# Patient Record
Sex: Male | Born: 1955 | Race: White | Hispanic: No
Health system: Southern US, Community
[De-identification: ages and names within clinical notes are randomized; demographics above are authoritative.]

---

## 1999-11-04 ENCOUNTER — Emergency Department (HOSPITAL_COMMUNITY): Admission: EM | Admit: 1999-11-04 | Discharge: 1999-11-05 | Payer: Self-pay | Admitting: Internal Medicine

## 1999-11-04 ENCOUNTER — Encounter: Payer: Self-pay | Admitting: Internal Medicine

## 1999-11-05 ENCOUNTER — Encounter: Payer: Self-pay | Admitting: Emergency Medicine

## 2001-06-19 ENCOUNTER — Emergency Department (HOSPITAL_COMMUNITY): Admission: EM | Admit: 2001-06-19 | Discharge: 2001-06-19 | Payer: Self-pay

## 2008-03-15 ENCOUNTER — Emergency Department (HOSPITAL_COMMUNITY): Admission: EM | Admit: 2008-03-15 | Discharge: 2008-03-15 | Payer: Self-pay | Admitting: Emergency Medicine

## 2009-08-06 IMAGING — CT CT HEAD W/O CM
1 series · 16 of 30 positions shown, 20 images · non-contrast
Comparison: None.

CLINICAL DATA: Right hand and arm numbness for days.

CT HEAD WITHOUT CONTRAST
TECHNIQUE: Contiguous axial images were obtained from the base of
the skull through the vertex without contrast.

[Series 2: headseq 4.8 h45s · axial · 0.43mm/px · z∈[-169,-17]mm · 16 of 36 slices shown, 20 images]
[im 2/36  brain]
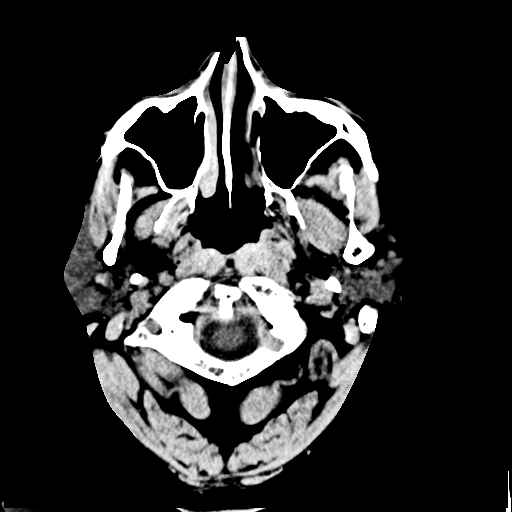
[im 2/36  bone]
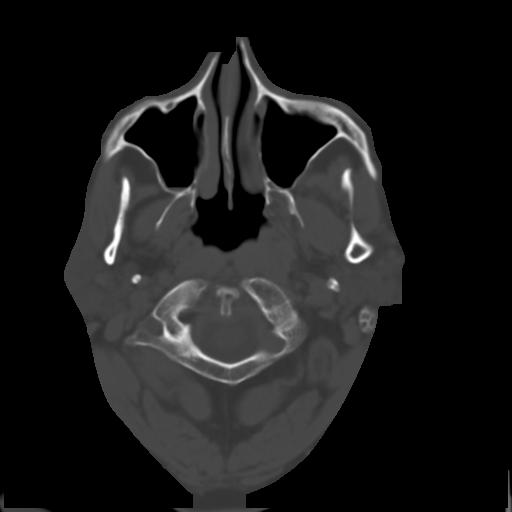
[im 4/36  brain]
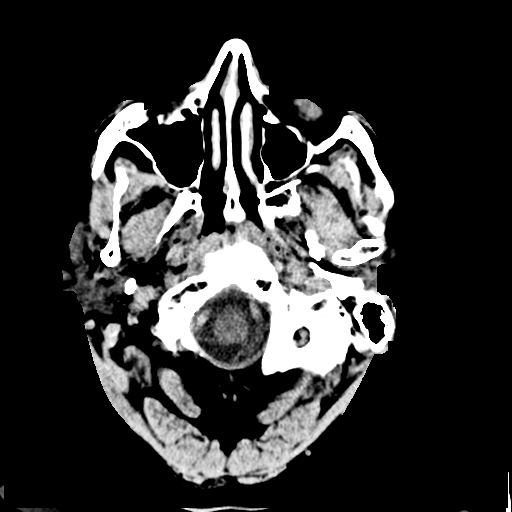
[im 7/36  brain]
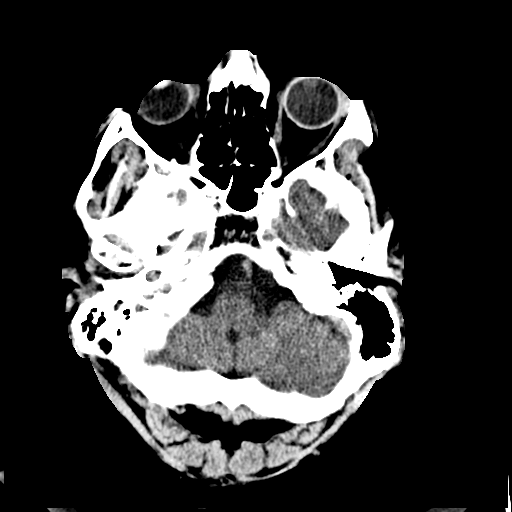
[im 9/36  brain]
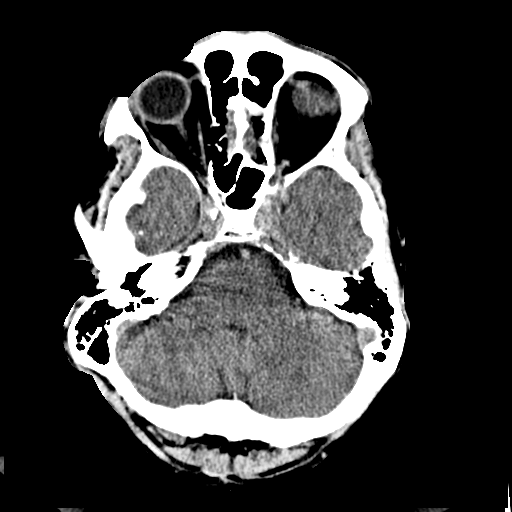
[im 10/36  brain]
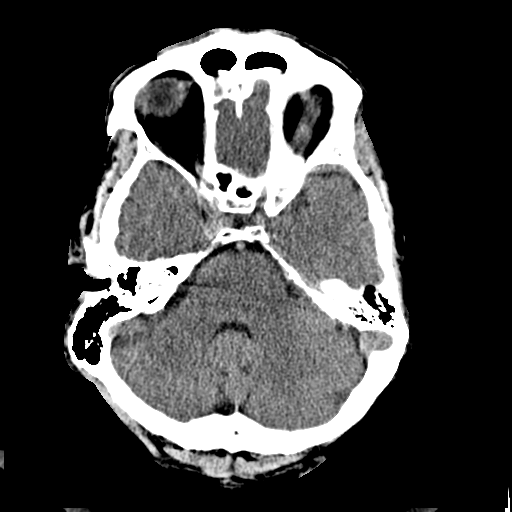
[im 10/36  bone]
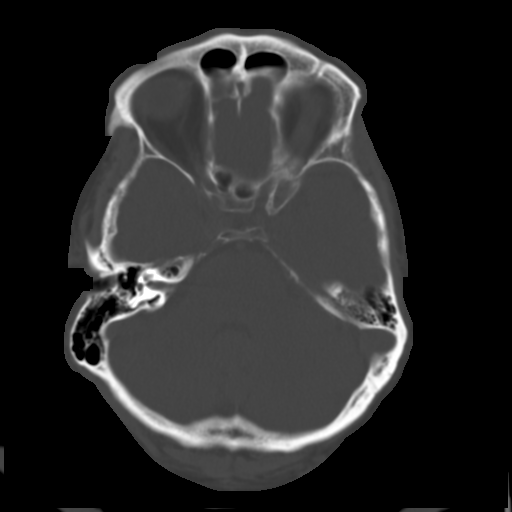
[im 13/36  brain]
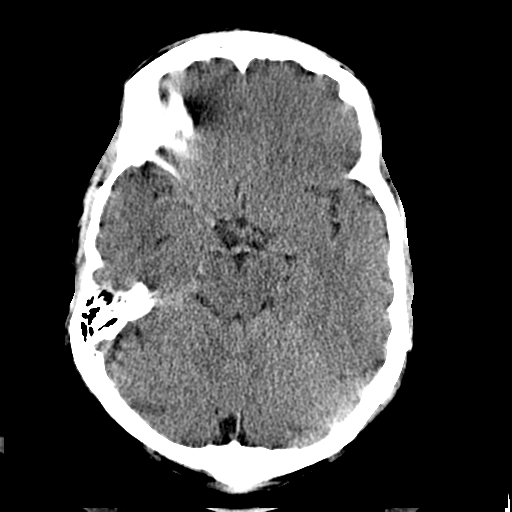
[im 15/36  brain]
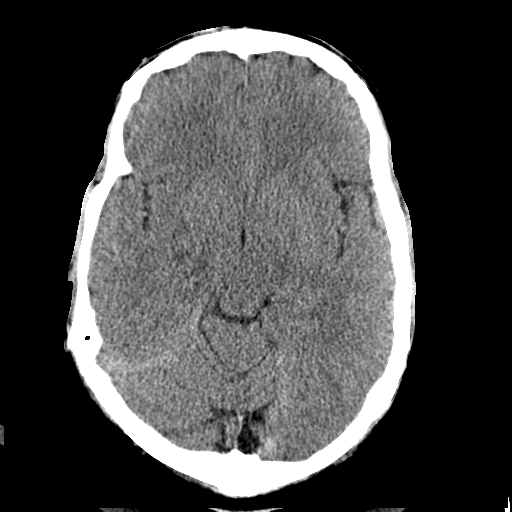
[im 17/36  brain]
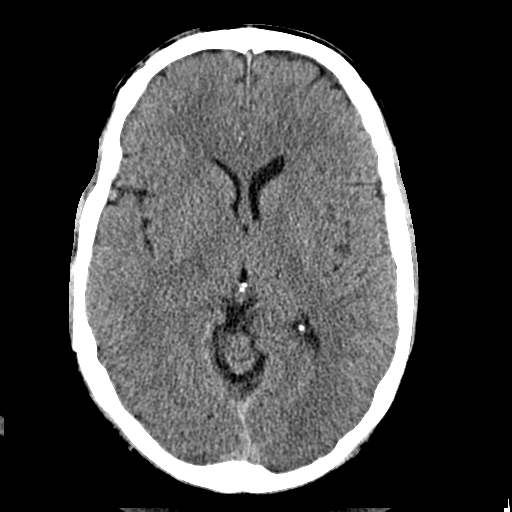
[im 19/36  brain]
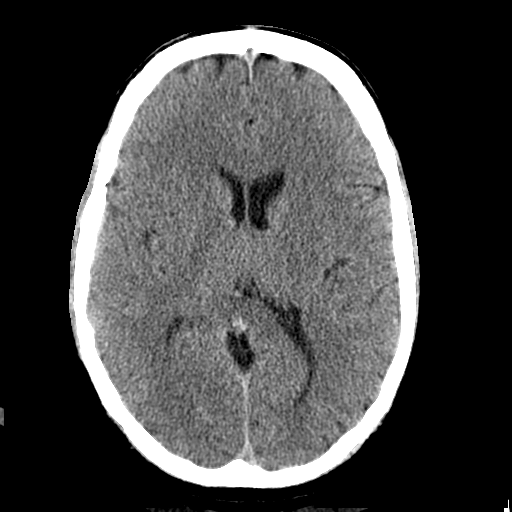
[im 19/36  bone]
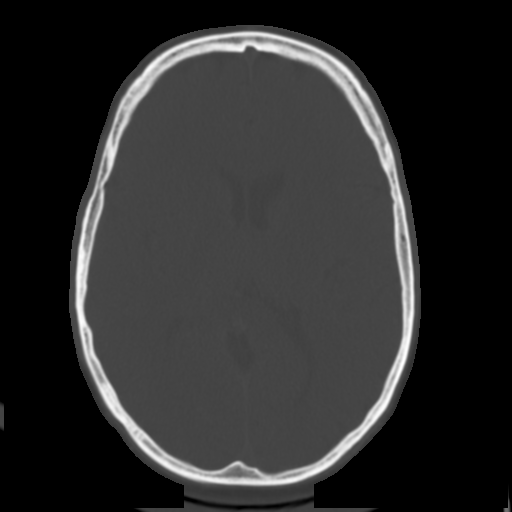
[im 21/36  brain]
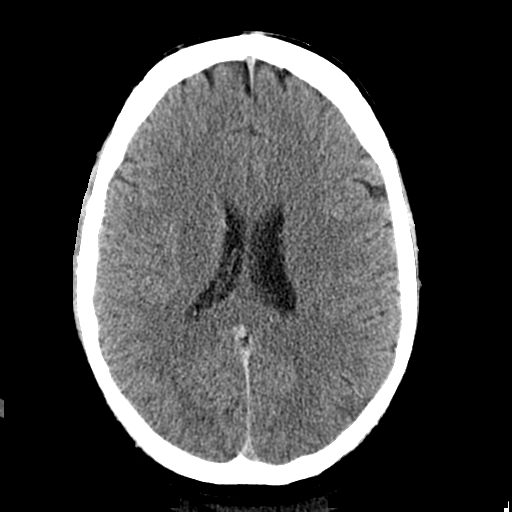
[im 23/36  brain]
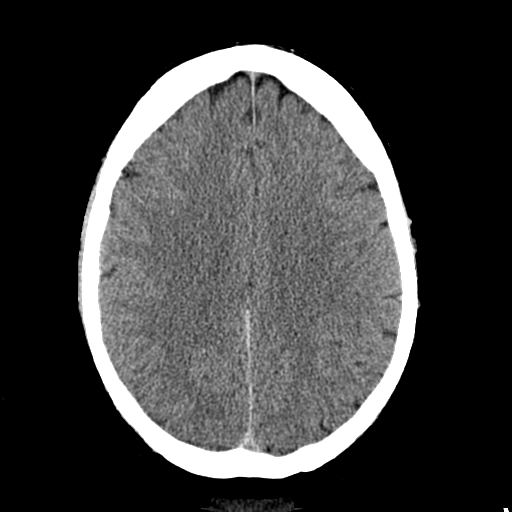
[im 26/36  brain]
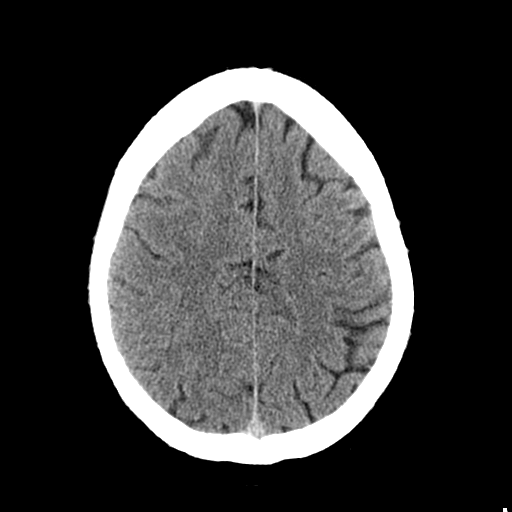
[im 27/36  brain]
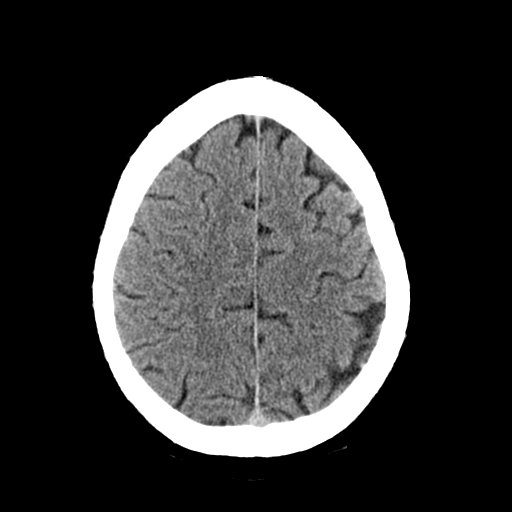
[im 27/36  bone]
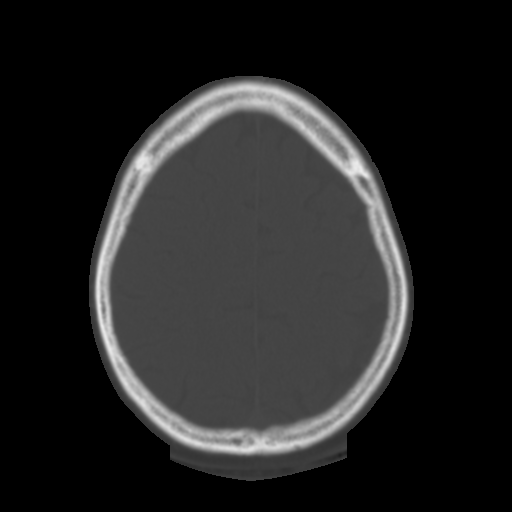
[im 29/36  brain]
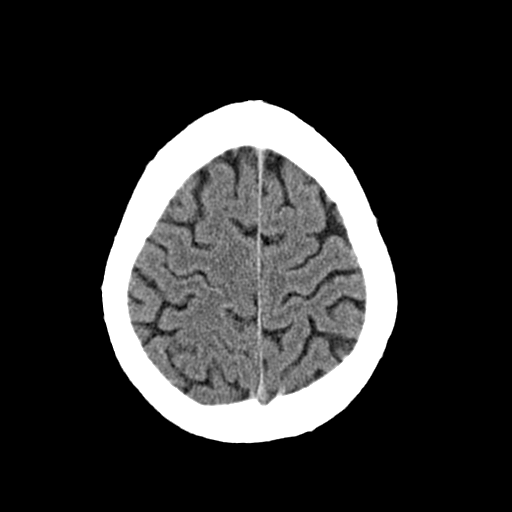
[im 32/36  brain]
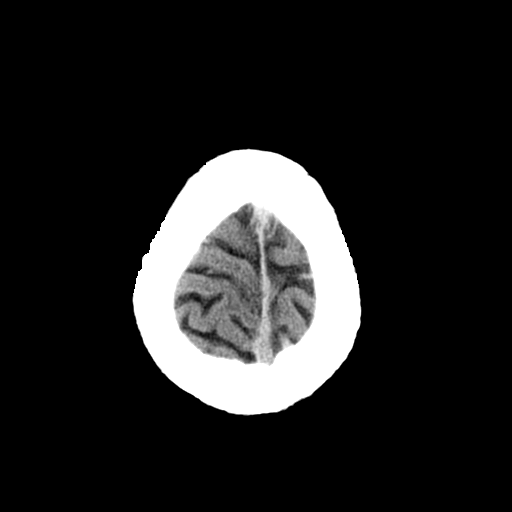
[im 34/36  brain]
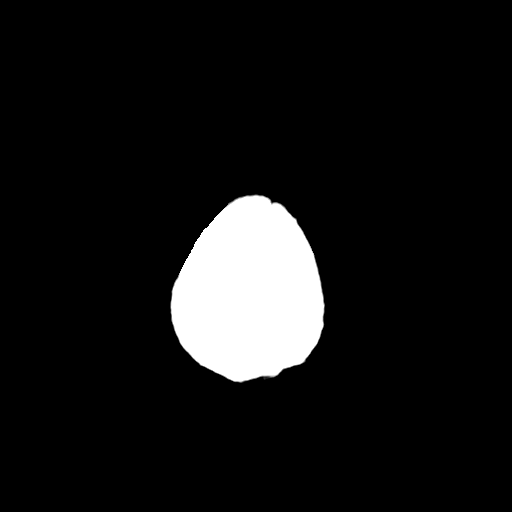

[16 of 30 positions shown; findings below may reference images not displayed]

FINDINGS: No intracranial hemorrhage.  No hydrocephalus.  No
intracranial mass lesion detected on this unenhanced exam.  No CT
evidence of large acute infarct.  Minimal mucosal thickening right
maxillary sinus.  No bony destructive lesion.  If a small acute
infarct is of high clinical concern MR imaging may be considered.
IMPRESSION: No intracranial hemorrhage.

No CT evidence of large acute infarct.  Please see above
discussion.

## 2011-04-14 LAB — COMPREHENSIVE METABOLIC PANEL
Albumin: 3.7
Alkaline Phosphatase: 37 — ABNORMAL LOW
BUN: 4 — ABNORMAL LOW
Calcium: 8.8
Creatinine, Ser: 0.85
Glucose, Bld: 99
Total Protein: 6.3

## 2011-04-14 LAB — CBC
HCT: 48.3
Hemoglobin: 16.5
MCHC: 34.2
MCV: 97.8
Platelets: 194
RBC: 4.93
RDW: 12.5
WBC: 11.4 — ABNORMAL HIGH

## 2011-04-14 LAB — DIFFERENTIAL
Basophils Relative: 0
Lymphocytes Relative: 14
Monocytes Absolute: 0.6
Monocytes Relative: 5
Neutro Abs: 9 — ABNORMAL HIGH
Neutrophils Relative %: 79 — ABNORMAL HIGH

## 2011-04-14 LAB — PROTIME-INR: INR: 0.9

## 2011-04-14 LAB — APTT: aPTT: 20 — ABNORMAL LOW

## 2013-12-21 ENCOUNTER — Encounter (HOSPITAL_COMMUNITY): Payer: Self-pay | Admitting: Emergency Medicine

## 2013-12-21 ENCOUNTER — Emergency Department (HOSPITAL_COMMUNITY): Payer: Medicaid Other

## 2013-12-21 ENCOUNTER — Inpatient Hospital Stay (HOSPITAL_COMMUNITY): Payer: Medicaid Other

## 2013-12-21 ENCOUNTER — Inpatient Hospital Stay (HOSPITAL_COMMUNITY)
Admission: EM | Admit: 2013-12-21 | Discharge: 2014-01-01 | DRG: 248 | Disposition: A | Discharge status: Hospice | Payer: Medicaid Other | Attending: Pulmonary Disease | Admitting: Pulmonary Disease

## 2013-12-21 ENCOUNTER — Encounter (HOSPITAL_COMMUNITY)
Admission: EM | Disposition: A | Discharge status: Hospice | Payer: Medicaid Other | Source: Home / Self Care | Attending: Pulmonary Disease

## 2013-12-21 DIAGNOSIS — G931 Anoxic brain damage, not elsewhere classified: Secondary | ICD-10-CM | POA: Diagnosis present

## 2013-12-21 DIAGNOSIS — G9340 Encephalopathy, unspecified: Secondary | ICD-10-CM | POA: Diagnosis present

## 2013-12-21 DIAGNOSIS — R0682 Tachypnea, not elsewhere classified: Secondary | ICD-10-CM | POA: Diagnosis not present

## 2013-12-21 DIAGNOSIS — E870 Hyperosmolality and hypernatremia: Secondary | ICD-10-CM | POA: Diagnosis not present

## 2013-12-21 DIAGNOSIS — E872 Acidosis, unspecified: Secondary | ICD-10-CM | POA: Diagnosis present

## 2013-12-21 DIAGNOSIS — I4901 Ventricular fibrillation: Secondary | ICD-10-CM | POA: Diagnosis present

## 2013-12-21 DIAGNOSIS — R0681 Apnea, not elsewhere classified: Secondary | ICD-10-CM | POA: Diagnosis not present

## 2013-12-21 DIAGNOSIS — Z6827 Body mass index (BMI) 27.0-27.9, adult: Secondary | ICD-10-CM | POA: Diagnosis not present

## 2013-12-21 DIAGNOSIS — R4020 Unspecified coma: Secondary | ICD-10-CM | POA: Diagnosis present

## 2013-12-21 DIAGNOSIS — Z515 Encounter for palliative care: Secondary | ICD-10-CM | POA: Diagnosis not present

## 2013-12-21 DIAGNOSIS — I469 Cardiac arrest, cause unspecified: Secondary | ICD-10-CM | POA: Diagnosis present

## 2013-12-21 DIAGNOSIS — I498 Other specified cardiac arrhythmias: Secondary | ICD-10-CM | POA: Diagnosis present

## 2013-12-21 DIAGNOSIS — I2119 ST elevation (STEMI) myocardial infarction involving other coronary artery of inferior wall: Secondary | ICD-10-CM | POA: Diagnosis present

## 2013-12-21 DIAGNOSIS — E876 Hypokalemia: Secondary | ICD-10-CM | POA: Diagnosis not present

## 2013-12-21 DIAGNOSIS — I2510 Atherosclerotic heart disease of native coronary artery without angina pectoris: Secondary | ICD-10-CM | POA: Diagnosis present

## 2013-12-21 DIAGNOSIS — I4510 Unspecified right bundle-branch block: Secondary | ICD-10-CM | POA: Diagnosis present

## 2013-12-21 DIAGNOSIS — Z66 Do not resuscitate: Secondary | ICD-10-CM | POA: Diagnosis not present

## 2013-12-21 DIAGNOSIS — I509 Heart failure, unspecified: Secondary | ICD-10-CM | POA: Diagnosis not present

## 2013-12-21 DIAGNOSIS — J690 Pneumonitis due to inhalation of food and vomit: Secondary | ICD-10-CM | POA: Diagnosis not present

## 2013-12-21 DIAGNOSIS — D696 Thrombocytopenia, unspecified: Secondary | ICD-10-CM | POA: Diagnosis not present

## 2013-12-21 DIAGNOSIS — R569 Unspecified convulsions: Secondary | ICD-10-CM | POA: Diagnosis not present

## 2013-12-21 DIAGNOSIS — I4891 Unspecified atrial fibrillation: Secondary | ICD-10-CM | POA: Diagnosis present

## 2013-12-21 DIAGNOSIS — J9601 Acute respiratory failure with hypoxia: Secondary | ICD-10-CM | POA: Diagnosis present

## 2013-12-21 DIAGNOSIS — J9600 Acute respiratory failure, unspecified whether with hypoxia or hypercapnia: Secondary | ICD-10-CM | POA: Diagnosis present

## 2013-12-21 DIAGNOSIS — D649 Anemia, unspecified: Secondary | ICD-10-CM | POA: Diagnosis present

## 2013-12-21 DIAGNOSIS — F05 Delirium due to known physiological condition: Secondary | ICD-10-CM | POA: Diagnosis not present

## 2013-12-21 DIAGNOSIS — I5021 Acute systolic (congestive) heart failure: Secondary | ICD-10-CM | POA: Diagnosis not present

## 2013-12-21 DIAGNOSIS — I369 Nonrheumatic tricuspid valve disorder, unspecified: Secondary | ICD-10-CM

## 2013-12-21 DIAGNOSIS — R7309 Other abnormal glucose: Secondary | ICD-10-CM | POA: Diagnosis present

## 2013-12-21 HISTORY — PX: LEFT HEART CATHETERIZATION WITH CORONARY ANGIOGRAM: SHX5451

## 2013-12-21 HISTORY — PX: PERCUTANEOUS CORONARY STENT INTERVENTION (PCI-S): SHX5485

## 2013-12-21 LAB — BASIC METABOLIC PANEL
BUN: 10 mg/dL (ref 6–23)
BUN: 12 mg/dL (ref 6–23)
BUN: 14 mg/dL (ref 6–23)
BUN: 14 mg/dL (ref 6–23)
BUN: 17 mg/dL (ref 6–23)
BUN: 18 mg/dL (ref 6–23)
BUN: 18 mg/dL (ref 6–23)
BUN: 17 mg/dL (ref 6–23)
CALCIUM: 7.8 mg/dL — AB (ref 8.4–10.5)
CHLORIDE: 107 meq/L (ref 96–112)
CHLORIDE: 114 meq/L — AB (ref 96–112)
CHLORIDE: 112 meq/L (ref 96–112)
CO2: 15 meq/L — AB (ref 19–32)
CO2: 21 meq/L (ref 19–32)
CO2: 22 meq/L (ref 19–32)
CO2: 22 mEq/L (ref 19–32)
CO2: 20 mEq/L (ref 19–32)
CO2: 18 meq/L — AB (ref 19–32)
CO2: 19 mEq/L (ref 19–32)
CO2: 19 mEq/L (ref 19–32)
CREATININE: 1.02 mg/dL (ref 0.50–1.35)
CREATININE: 0.63 mg/dL (ref 0.50–1.35)
CREATININE: 0.83 mg/dL (ref 0.50–1.35)
CREATININE: 0.74 mg/dL (ref 0.50–1.35)
Calcium: 8 mg/dL — ABNORMAL LOW (ref 8.4–10.5)
Calcium: 7 mg/dL — ABNORMAL LOW (ref 8.4–10.5)
Calcium: 7.5 mg/dL — ABNORMAL LOW (ref 8.4–10.5)
Calcium: 7.5 mg/dL — ABNORMAL LOW (ref 8.4–10.5)
Calcium: 7.2 mg/dL — ABNORMAL LOW (ref 8.4–10.5)
Calcium: 7 mg/dL — ABNORMAL LOW (ref 8.4–10.5)
Calcium: 7.1 mg/dL — ABNORMAL LOW (ref 8.4–10.5)
Chloride: 104 mEq/L (ref 96–112)
Chloride: 112 mEq/L (ref 96–112)
Chloride: 115 mEq/L — ABNORMAL HIGH (ref 96–112)
Chloride: 113 mEq/L — ABNORMAL HIGH (ref 96–112)
Chloride: 112 mEq/L (ref 96–112)
Creatinine, Ser: 0.7 mg/dL (ref 0.50–1.35)
Creatinine, Ser: 0.68 mg/dL (ref 0.50–1.35)
Creatinine, Ser: 0.65 mg/dL (ref 0.50–1.35)
Creatinine, Ser: 0.74 mg/dL (ref 0.50–1.35)
GFR calc Af Amer: >90 mL/min (ref >90)
GFR calc Af Amer: >90 mL/min (ref >90)
GFR calc Af Amer: >90 mL/min (ref >90)
GFR calc Af Amer: >90 mL/min (ref >90)
GFR calc Af Amer: >90 mL/min (ref >90)
GFR calc non Af Amer: 80 mL/min — ABNORMAL LOW (ref >90)
GFR calc non Af Amer: >90 mL/min (ref >90)
GFR calc non Af Amer: >90 mL/min (ref >90)
GFR calc non Af Amer: >90 mL/min (ref >90)
GFR calc non Af Amer: >90 mL/min (ref >90)
GFR calc non Af Amer: >90 mL/min (ref >90)
GFR calc non Af Amer: >90 mL/min (ref >90)
GLUCOSE: 162 mg/dL — AB (ref 70–99)
Glucose, Bld: 392 mg/dL — ABNORMAL HIGH (ref 70–99)
Glucose, Bld: 114 mg/dL — ABNORMAL HIGH (ref 70–99)
Glucose, Bld: 241 mg/dL — ABNORMAL HIGH (ref 70–99)
Glucose, Bld: 116 mg/dL — ABNORMAL HIGH (ref 70–99)
Glucose, Bld: 353 mg/dL — ABNORMAL HIGH (ref 70–99)
Glucose, Bld: 192 mg/dL — ABNORMAL HIGH (ref 70–99)
Glucose, Bld: 141 mg/dL — ABNORMAL HIGH (ref 70–99)
POTASSIUM: 4.2 meq/L (ref 3.7–5.3)
POTASSIUM: 4.1 meq/L (ref 3.7–5.3)
POTASSIUM: 4.3 meq/L (ref 3.7–5.3)
Potassium: 3.1 mEq/L — ABNORMAL LOW (ref 3.7–5.3)
Potassium: 4 mEq/L (ref 3.7–5.3)
Potassium: 3.2 mEq/L — ABNORMAL LOW (ref 3.7–5.3)
Potassium: 4.1 mEq/L (ref 3.7–5.3)
Potassium: 4.2 mEq/L (ref 3.7–5.3)
SODIUM: 149 meq/L — AB (ref 137–147)
SODIUM: 145 meq/L (ref 137–147)
Sodium: 145 mEq/L (ref 137–147)
Sodium: 146 mEq/L (ref 137–147)
Sodium: 148 mEq/L — ABNORMAL HIGH (ref 137–147)
Sodium: 149 mEq/L — ABNORMAL HIGH (ref 137–147)
Sodium: 145 mEq/L (ref 137–147)
Sodium: 147 mEq/L (ref 137–147)

## 2013-12-21 LAB — GLUCOSE, CAPILLARY
GLUCOSE-CAPILLARY: 297 mg/dL — AB (ref 70–99)
GLUCOSE-CAPILLARY: 220 mg/dL — AB (ref 70–99)
GLUCOSE-CAPILLARY: 213 mg/dL — AB (ref 70–99)
GLUCOSE-CAPILLARY: 148 mg/dL — AB (ref 70–99)
GLUCOSE-CAPILLARY: 137 mg/dL — AB (ref 70–99)
GLUCOSE-CAPILLARY: 103 mg/dL — AB (ref 70–99)
GLUCOSE-CAPILLARY: 104 mg/dL — AB (ref 70–99)
GLUCOSE-CAPILLARY: 155 mg/dL — AB (ref 70–99)
GLUCOSE-CAPILLARY: 134 mg/dL — AB (ref 70–99)
GLUCOSE-CAPILLARY: 129 mg/dL — AB (ref 70–99)
GLUCOSE-CAPILLARY: 126 mg/dL — AB (ref 70–99)
Glucose-Capillary: 145 mg/dL — ABNORMAL HIGH (ref 70–99)
Glucose-Capillary: 67 mg/dL — ABNORMAL LOW (ref 70–99)
Glucose-Capillary: 141 mg/dL — ABNORMAL HIGH (ref 70–99)

## 2013-12-21 LAB — APTT
APTT: 38 s — AB (ref 24–37)
APTT: 31 s (ref 24–37)
aPTT: 32 seconds (ref 24–37)

## 2013-12-21 LAB — BLOOD GAS, ARTERIAL
Acid-base deficit: 1.5 mmol/L (ref 0.0–2.0)
Bicarbonate: 23.5 mEq/L (ref 20.0–24.0)
DRAWN BY: 10006
FIO2: 1 %
LHR: 20 {breaths}/min
O2 SAT: 96.4 %
PCO2 ART: 41.2 mmHg (ref 35.0–45.0)
PEEP: 5 cmH2O
PO2 ART: 84 mmHg (ref 80.0–100.0)
Patient temperature: 95.4
TCO2: 24.9 mmol/L (ref 0–100)
VT: 500 mL
pH, Arterial: 7.364 (ref 7.350–7.450)

## 2013-12-21 LAB — CBC
HCT: 43.2 % (ref 39.0–52.0)
HCT: 43.9 % (ref 39.0–52.0)
HEMATOCRIT: 43.6 % (ref 39.0–52.0)
HEMOGLOBIN: 14.9 g/dL (ref 13.0–17.0)
Hemoglobin: 14.6 g/dL (ref 13.0–17.0)
Hemoglobin: 14.6 g/dL (ref 13.0–17.0)
MCH: 34.2 pg — ABNORMAL HIGH (ref 26.0–34.0)
MCH: 33.7 pg (ref 26.0–34.0)
MCH: 33.5 pg (ref 26.0–34.0)
MCHC: 33.8 g/dL (ref 30.0–36.0)
MCHC: 33.9 g/dL (ref 30.0–36.0)
MCHC: 33.5 g/dL (ref 30.0–36.0)
MCV: 101.2 fL — ABNORMAL HIGH (ref 78.0–100.0)
MCV: 99.3 fL (ref 78.0–100.0)
MCV: 100 fL (ref 78.0–100.0)
Platelets: 149 10*3/uL — ABNORMAL LOW (ref 150–400)
Platelets: DECREASED 10*3/uL (ref 150–400)
Platelets: 109 10*3/uL — ABNORMAL LOW (ref 150–400)
RBC: 4.27 MIL/uL (ref 4.22–5.81)
RBC: 4.42 MIL/uL (ref 4.22–5.81)
RBC: 4.36 MIL/uL (ref 4.22–5.81)
RDW: 12.4 % (ref 11.5–15.5)
RDW: 12.3 % (ref 11.5–15.5)
RDW: 12.6 % (ref 11.5–15.5)
WBC: 9 10*3/uL (ref 4.0–10.5)
WBC: 10.4 10*3/uL (ref 4.0–10.5)
WBC: 13 10*3/uL — ABNORMAL HIGH (ref 4.0–10.5)

## 2013-12-21 LAB — CBC WITH DIFFERENTIAL/PLATELET
Basophils Absolute: 0 10*3/uL (ref 0.0–0.1)
Basophils Relative: 1 % (ref 0–1)
Eosinophils Absolute: 0.2 10*3/uL (ref 0.0–0.7)
Eosinophils Relative: 2 % (ref 0–5)
HEMATOCRIT: 41 % (ref 39.0–52.0)
HEMOGLOBIN: 12.9 g/dL — AB (ref 13.0–17.0)
LYMPHS ABS: 4.7 10*3/uL — AB (ref 0.7–4.0)
LYMPHS PCT: 57 % — AB (ref 12–46)
MCH: 32.8 pg (ref 26.0–34.0)
MCHC: 31.5 g/dL (ref 30.0–36.0)
MCV: 104.3 fL — ABNORMAL HIGH (ref 78.0–100.0)
MONO ABS: 0.3 10*3/uL (ref 0.1–1.0)
Monocytes Relative: 4 % (ref 3–12)
NEUTROS PCT: 36 % — AB (ref 43–77)
Neutro Abs: 3 10*3/uL (ref 1.7–7.7)
Platelets: 92 10*3/uL — ABNORMAL LOW (ref 150–400)
RBC: 3.93 MIL/uL — AB (ref 4.22–5.81)
RDW: 12.4 % (ref 11.5–15.5)
WBC: 8.2 10*3/uL (ref 4.0–10.5)

## 2013-12-21 LAB — POCT I-STAT 3, ART BLOOD GAS (G3+)
Acid-base deficit: 10 mmol/L — ABNORMAL HIGH (ref 0.0–2.0)
Bicarbonate: 17.5 mEq/L — ABNORMAL LOW (ref 20.0–24.0)
O2 Saturation: 100 %
PCO2 ART: 43.8 mmHg (ref 35.0–45.0)
PH ART: 7.21 — AB (ref 7.350–7.450)
TCO2: 19 mmol/L (ref 0–100)
pO2, Arterial: 384 mmHg — ABNORMAL HIGH (ref 80.0–100.0)

## 2013-12-21 LAB — MRSA PCR SCREENING: MRSA by PCR: NEGATIVE

## 2013-12-21 LAB — PHOSPHORUS: Phosphorus: 1.7 mg/dL — ABNORMAL LOW (ref 2.3–4.6)

## 2013-12-21 LAB — TROPONIN I
TROPONIN I: 16.46 ng/mL — AB (ref <0.30)
TROPONIN I: 17.11 ng/mL — AB (ref <0.30)
Troponin I: 8.87 ng/mL (ref <0.30)

## 2013-12-21 LAB — I-STAT ARTERIAL BLOOD GAS, ED
ACID-BASE DEFICIT: 16 mmol/L — AB (ref 0.0–2.0)
BICARBONATE: 16 meq/L — AB (ref 20.0–24.0)
O2 SAT: 100 %
Patient temperature: 35.6
TCO2: 18 mmol/L (ref 0–100)
pCO2 arterial: 61.5 mmHg (ref 35.0–45.0)
pH, Arterial: 7.013 — CL (ref 7.350–7.450)
pO2, Arterial: 316 mmHg — ABNORMAL HIGH (ref 80.0–100.0)

## 2013-12-21 LAB — URINALYSIS, ROUTINE W REFLEX MICROSCOPIC
Glucose, UA: NEGATIVE mg/dL
Ketones, ur: NEGATIVE mg/dL
LEUKOCYTES UA: NEGATIVE
Nitrite: NEGATIVE
PROTEIN: 100 mg/dL — AB
Specific Gravity, Urine: 1.022 (ref 1.005–1.030)
UROBILINOGEN UA: 1 mg/dL (ref 0.0–1.0)
pH: 6 (ref 5.0–8.0)

## 2013-12-21 LAB — I-STAT TROPONIN, ED: TROPONIN I, POC: 0.19 ng/mL — AB (ref 0.00–0.08)

## 2013-12-21 LAB — LACTIC ACID, PLASMA
LACTIC ACID, VENOUS: 1.8 mmol/L (ref 0.5–2.2)
LACTIC ACID, VENOUS: 2.1 mmol/L (ref 0.5–2.2)
Lactic Acid, Venous: 2.2 mmol/L (ref 0.5–2.2)

## 2013-12-21 LAB — PROTIME-INR
INR: 1.37 (ref 0.00–1.49)
INR: 1.21 (ref 0.00–1.49)
INR: 1.11 (ref 0.00–1.49)
Prothrombin Time: 16.5 seconds — ABNORMAL HIGH (ref 11.6–15.2)
Prothrombin Time: 15 seconds (ref 11.6–15.2)
Prothrombin Time: 14.1 seconds (ref 11.6–15.2)

## 2013-12-21 LAB — MAGNESIUM: Magnesium: 1.6 mg/dL (ref 1.5–2.5)

## 2013-12-21 LAB — POCT ACTIVATED CLOTTING TIME: ACTIVATED CLOTTING TIME: 420 s

## 2013-12-21 LAB — URINE MICROSCOPIC-ADD ON

## 2013-12-21 LAB — PLATELET COUNT: PLATELETS: 130 10*3/uL — AB (ref 150–400)

## 2013-12-21 LAB — CORTISOL: Cortisol, Plasma: 28.1 ug/dL

## 2013-12-21 LAB — I-STAT CG4 LACTIC ACID, ED: Lactic Acid, Venous: 8.94 mmol/L — ABNORMAL HIGH (ref 0.5–2.2)

## 2013-12-21 SURGERY — LEFT HEART CATHETERIZATION WITH CORONARY ANGIOGRAM
Anesthesia: LOCAL

## 2013-12-21 MED ORDER — FENTANYL CITRATE 0.05 MG/ML IJ SOLN
25.0000 ug/h | INTRAMUSCULAR | Status: DC
  Administered 2013-12-21: 50 ug/h via INTRAVENOUS
  Filled 2013-12-21: qty 50

## 2013-12-21 MED ORDER — ATROPINE SULFATE 1 MG/ML IJ SOLN
INTRAMUSCULAR | Status: DC | PRN
  Administered 2013-12-21: 1 mg via INTRAVENOUS

## 2013-12-21 MED ORDER — LIDOCAINE HCL (PF) 1 % IJ SOLN
INTRAMUSCULAR | Status: AC
  Filled 2013-12-21: qty 30

## 2013-12-21 MED ORDER — CHLORHEXIDINE GLUCONATE 0.12 % MT SOLN
15.0000 mL | Freq: Two times a day (BID) | OROMUCOSAL | Status: DC
  Administered 2013-12-21 – 2013-12-27 (×13): 15 mL via OROMUCOSAL
  Filled 2013-12-21 (×13): qty 15

## 2013-12-21 MED ORDER — CISATRACURIUM BOLUS VIA INFUSION
0.0500 mg/kg | INTRAVENOUS | Status: DC | PRN
  Filled 2013-12-21: qty 4

## 2013-12-21 MED ORDER — BIOTENE DRY MOUTH MT LIQD
15.0000 mL | Freq: Four times a day (QID) | OROMUCOSAL | Status: DC
Start: 2013-12-21 — End: 2013-12-27
  Administered 2013-12-21 – 2013-12-27 (×24): 15 mL via OROMUCOSAL

## 2013-12-21 MED ORDER — SODIUM CHLORIDE 0.9 % IV SOLN
250.0000 mL | INTRAVENOUS | Status: DC | PRN
  Administered 2013-12-21: 250 mL via INTRAVENOUS

## 2013-12-21 MED ORDER — TIROFIBAN HCL IV 5 MG/100ML
0.1500 ug/kg/min | INTRAVENOUS | Status: DC
  Administered 2013-12-21 – 2013-12-22 (×4): 0.15 ug/kg/min via INTRAVENOUS
  Filled 2013-12-21 (×8): qty 100

## 2013-12-21 MED ORDER — HEPARIN SODIUM (PORCINE) 5000 UNIT/ML IJ SOLN: 5000.0000 [IU] | Freq: Three times a day (TID) | INTRAMUSCULAR | Status: DC

## 2013-12-21 MED ORDER — MIDAZOLAM HCL 2 MG/2ML IJ SOLN: 2.0000 mg | Freq: Once | INTRAMUSCULAR | Status: AC | PRN

## 2013-12-21 MED ORDER — ASPIRIN 81 MG PO CHEW: 324.0000 mg | CHEWABLE_TABLET | ORAL | Status: AC

## 2013-12-21 MED ORDER — FENTANYL CITRATE 0.05 MG/ML IJ SOLN: 100.0000 ug | Freq: Once | INTRAMUSCULAR | Status: AC | PRN

## 2013-12-21 MED ORDER — SODIUM CHLORIDE 0.9 % IV SOLN: 2000.0000 mL | Freq: Once | INTRAVENOUS | Status: AC

## 2013-12-21 MED ORDER — ASPIRIN 300 MG RE SUPP
300.0000 mg | RECTAL | Status: DC
  Administered 2013-12-21: 300 mg via RECTAL

## 2013-12-21 MED ORDER — NOREPINEPHRINE BITARTRATE 1 MG/ML IV SOLN
2.0000 ug/min | Freq: Once | INTRAVENOUS | Status: AC
  Administered 2013-12-21: 5 ug/min via INTRAVENOUS

## 2013-12-21 MED ORDER — ASPIRIN 300 MG RE SUPP: 300.0000 mg | RECTAL | Status: AC

## 2013-12-21 MED ORDER — CISATRACURIUM BOLUS VIA INFUSION
0.1000 mg/kg | Freq: Once | INTRAVENOUS | Status: AC
  Administered 2013-12-21: 7.7 mg via INTRAVENOUS
  Filled 2013-12-21: qty 8

## 2013-12-21 MED ORDER — SODIUM CHLORIDE 0.9 % IV SOLN
INTRAVENOUS | Status: DC
  Administered 2013-12-22: 02:00:00 via INTRAVENOUS
  Administered 2013-12-27: 10 mL/h via INTRAVENOUS

## 2013-12-21 MED ORDER — CISATRACURIUM BOLUS VIA INFUSION
0.1000 mg/kg | Freq: Once | INTRAVENOUS | Status: DC
  Filled 2013-12-21: qty 8

## 2013-12-21 MED ORDER — BIVALIRUDIN 250 MG IV SOLR
INTRAVENOUS | Status: AC
  Filled 2013-12-21: qty 250

## 2013-12-21 MED ORDER — TICAGRELOR 90 MG PO TABS
180.0000 mg | ORAL_TABLET | Freq: Once | ORAL | Status: AC
  Administered 2013-12-21: 180 mg via ORAL
  Filled 2013-12-21: qty 2

## 2013-12-21 MED ORDER — SODIUM CHLORIDE 0.9 % IV SOLN: INTRAVENOUS | Status: DC

## 2013-12-21 MED ORDER — FENTANYL BOLUS VIA INFUSION
50.0000 ug | INTRAVENOUS | Status: DC | PRN
  Administered 2013-12-21 – 2013-12-22 (×3): 50 ug via INTRAVENOUS
  Filled 2013-12-21: qty 50

## 2013-12-21 MED ORDER — SODIUM CHLORIDE 0.9 % IV SOLN
1.0000 mg/h | INTRAVENOUS | Status: DC
  Administered 2013-12-21: 2 mg/h via INTRAVENOUS
  Administered 2013-12-21: 4 mg/h via INTRAVENOUS
  Administered 2013-12-21: 2 mg/h via INTRAVENOUS
  Administered 2013-12-22 (×2): 5 mg/h via INTRAVENOUS
  Filled 2013-12-21 (×4): qty 10

## 2013-12-21 MED ORDER — DEXTROSE 50 % IV SOLN
INTRAVENOUS | Status: AC
Start: 2013-12-21 — End: 2013-12-22
  Filled 2013-12-21: qty 50

## 2013-12-21 MED ORDER — HEPARIN (PORCINE) IN NACL 2-0.9 UNIT/ML-% IJ SOLN
INTRAMUSCULAR | Status: AC
  Filled 2013-12-21: qty 1000

## 2013-12-21 MED ORDER — POTASSIUM CHLORIDE 10 MEQ/50ML IV SOLN
10.0000 meq | INTRAVENOUS | Status: AC
  Administered 2013-12-21 (×3): 10 meq via INTRAVENOUS
  Filled 2013-12-21 (×3): qty 50

## 2013-12-21 MED ORDER — MIDAZOLAM HCL 2 MG/2ML IJ SOLN: 2.0000 mg | Freq: Once | INTRAMUSCULAR | Status: AC

## 2013-12-21 MED ORDER — NOREPINEPHRINE BITARTRATE 1 MG/ML IV SOLN
0.5000 ug/min | INTRAVENOUS | Status: DC
  Administered 2013-12-22: 3 ug/min via INTRAVENOUS
  Filled 2013-12-21 (×3): qty 4

## 2013-12-21 MED ORDER — TICAGRELOR 90 MG PO TABS
90.0000 mg | ORAL_TABLET | Freq: Two times a day (BID) | ORAL | Status: DC
  Administered 2013-12-22 – 2013-12-27 (×11): 90 mg via NASOGASTRIC
  Filled 2013-12-21 (×12): qty 1

## 2013-12-21 MED ORDER — SODIUM CHLORIDE 0.9 % IV SOLN
1.0000 ug/kg/min | INTRAVENOUS | Status: DC
  Administered 2013-12-21: 1 ug/kg/min via INTRAVENOUS

## 2013-12-21 MED ORDER — FENTANYL CITRATE 0.05 MG/ML IJ SOLN
100.0000 ug | Freq: Once | INTRAMUSCULAR | Status: AC
  Administered 2013-12-21: 100 ug via INTRAVENOUS

## 2013-12-21 MED ORDER — ARTIFICIAL TEARS OP OINT
1.0000 "application " | TOPICAL_OINTMENT | Freq: Three times a day (TID) | OPHTHALMIC | Status: DC
  Administered 2013-12-21 – 2013-12-22 (×6): 1 via OPHTHALMIC
  Filled 2013-12-21 (×2): qty 3.5

## 2013-12-21 MED ORDER — ASPIRIN 81 MG PO CHEW
81.0000 mg | CHEWABLE_TABLET | Freq: Every day | ORAL | Status: DC
  Administered 2013-12-22 – 2013-12-27 (×6): 81 mg via NASOGASTRIC
  Filled 2013-12-21 (×6): qty 1

## 2013-12-21 MED ORDER — SODIUM CHLORIDE 0.9 % IV SOLN
250.0000 mL | INTRAVENOUS | Status: DC
  Administered 2013-12-23 – 2013-12-24 (×2): 250 mL via INTRAVENOUS

## 2013-12-21 MED ORDER — INSULIN ASPART 100 UNIT/ML ~~LOC~~ SOLN
2.0000 [IU] | SUBCUTANEOUS | Status: DC
  Administered 2013-12-21: 6 [IU] via SUBCUTANEOUS

## 2013-12-21 MED ORDER — ASPIRIN 300 MG RE SUPP: 300.0000 mg | RECTAL | Status: DC

## 2013-12-21 MED ORDER — PANTOPRAZOLE SODIUM 40 MG IV SOLR
40.0000 mg | Freq: Every day | INTRAVENOUS | Status: DC
  Administered 2013-12-21 – 2013-12-23 (×3): 40 mg via INTRAVENOUS
  Filled 2013-12-21 (×4): qty 40

## 2013-12-21 MED ORDER — FENTANYL CITRATE 0.05 MG/ML IJ SOLN
25.0000 ug/h | INTRAMUSCULAR | Status: DC
  Administered 2013-12-21: 150 ug/h via INTRAVENOUS
  Administered 2013-12-21: 100 ug/h via INTRAVENOUS
  Filled 2013-12-21 (×2): qty 50

## 2013-12-21 MED ORDER — SODIUM CHLORIDE 0.9 % IV SOLN
INTRAVENOUS | Status: DC
  Administered 2013-12-21: 21:00:00 via INTRAVENOUS

## 2013-12-21 MED ORDER — SODIUM CHLORIDE 0.9 % IV SOLN
INTRAVENOUS | Status: DC
  Filled 2013-12-21: qty 1

## 2013-12-21 MED ORDER — DEXTROSE 5 % IV SOLN
0.5000 ug/min | INTRAVENOUS | Status: DC
  Filled 2013-12-21: qty 4

## 2013-12-21 MED ORDER — SODIUM CHLORIDE 0.9 % IV SOLN
INTRAVENOUS | Status: DC
  Administered 2013-12-21: 1000 mL via INTRAVENOUS
  Administered 2013-12-22: 01:00:00 via INTRAVENOUS

## 2013-12-21 MED ORDER — TIROFIBAN HCL IV 5 MG/100ML
INTRAVENOUS | Status: AC
Start: 2013-12-21 — End: 2013-12-21
  Filled 2013-12-21: qty 100

## 2013-12-21 MED ORDER — SODIUM CHLORIDE 0.9 % IV SOLN
1.0000 ug/kg/min | INTRAVENOUS | Status: DC
  Administered 2013-12-21: 1 ug/kg/min via INTRAVENOUS
  Filled 2013-12-21: qty 20

## 2013-12-21 MED ORDER — SODIUM CHLORIDE 0.9 % IV SOLN
2000.0000 mL | Freq: Once | INTRAVENOUS | Status: AC
  Administered 2013-12-21: 2000 mL via INTRAVENOUS

## 2013-12-21 MED ORDER — DEXTROSE 50 % IV SOLN
25.0000 mL | Freq: Once | INTRAVENOUS | Status: AC | PRN
  Administered 2013-12-21: 25 mL via INTRAVENOUS

## 2013-12-21 MED ORDER — INSULIN ASPART 100 UNIT/ML ~~LOC~~ SOLN
2.0000 [IU] | SUBCUTANEOUS | Status: DC
  Administered 2013-12-21 (×2): 2 [IU] via SUBCUTANEOUS
  Administered 2013-12-21: 4 [IU] via SUBCUTANEOUS
  Administered 2013-12-21 – 2013-12-24 (×5): 2 [IU] via SUBCUTANEOUS

## 2013-12-21 MED ORDER — NITROGLYCERIN 0.2 MG/ML ON CALL CATH LAB
INTRAVENOUS | Status: AC
  Filled 2013-12-21: qty 1

## 2013-12-21 MED ORDER — HEPARIN SODIUM (PORCINE) 5000 UNIT/ML IJ SOLN
5000.0000 [IU] | Freq: Three times a day (TID) | INTRAMUSCULAR | Status: DC
  Administered 2013-12-21 – 2013-12-27 (×18): 5000 [IU] via SUBCUTANEOUS
  Filled 2013-12-21 (×22): qty 1

## 2013-12-21 MED ORDER — MIDAZOLAM BOLUS VIA INFUSION
2.0000 mg | INTRAVENOUS | Status: DC | PRN
  Administered 2013-12-21 – 2013-12-22 (×3): 2 mg via INTRAVENOUS
  Filled 2013-12-21: qty 2

## 2013-12-21 MED FILL — Medication: Qty: 1 | Status: AC

## 2013-12-21 NOTE — Progress Notes (Signed)
Dr. Zachery ConchFriedman with cardiology paged, made aware of platelet count of 130, also aware patient on Aggrastat infusion until rewarmed on hypothermia protocol per order. Instructed to continue Aggrastat infusion at current rate, obtain platelet count with ordered CBC at 0500 12/22/13, and page MD if platelet count <100. Will continue to assess and monitor patient closely.

## 2013-12-21 NOTE — Consult Note (Signed)
Reason for Consult: Cardiac Arrest Referring Physician: [name] is an 58 y.o. male.  HPI: Darryl Mays is a 58 yo man who was working this evening and stopped to eat with co-workers and after he had not returned for upwards of 20 minutes, they went to the bathroom and found him unresponsive. EMS were called and performed ACLS and he ultimately received 4 shocks in the field for presumed ventricular fibrillation and on arrival to the ED he had 3 more shocks for presumed ventricular fibrillation. In addition he received amiodarone and epinephrine. Pulmonary Critical Consulted for consideration of hypothermia protocol and Cardiology consulted given cardiac arrest and specifically with question of potential cath lab evalation   58 y.o. M with unknown PMH, brought to ED after suffering Vfib arrest. In field received 4 shocks + 3 while in ED. PCCM was consulted for initiation of hypothermia protocol / admission.  History was performed through chart review, speaking with nurses, EMS and ER personnel given critical illness History reviewed. No pertinent past medical history.  History reviewed. No pertinent past surgical history.  No family history on file.  Social History:  has no tobacco, alcohol, and drug history on file.  Allergies: No Known Allergies  Medications:  I have reviewed the patient's current medications. Prior to Admission:  No prescriptions prior to admission   Scheduled: . sodium chloride  2,000 mL Intravenous Once  . artificial tears  1 application Both Eyes 3 times per day  . aspirin  324 mg Oral NOW   Or  . aspirin  300 mg Rectal NOW  . cisatracurium  0.1 mg/kg Intravenous Once  . fentaNYL  100 mcg Intravenous Once  . heparin  5,000 Units Subcutaneous 3 times per day  . insulin aspart  2-6 Units Subcutaneous 6 times per day  . midazolam  2 mg Intravenous Once  . pantoprazole (PROTONIX) IV  40 mg Intravenous QHS   Continuous: . sodium chloride    .  cisatracurium (NIMBEX) infusion    . fentaNYL infusion INTRAVENOUS    . midazolam (VERSED) infusion    . norepinephrine (LEVOPHED) Adult infusion      Results for orders placed during the hospital encounter of 12/21/13 (from the past 48 hour(s))  CBC WITH DIFFERENTIAL     Status: Abnormal   Collection Time    12/21/13  1:40 AM      Result Value Ref Range   WBC 8.2  4.0 - 10.5 K/uL   RBC 3.93 (*) 4.22 - 5.81 MIL/uL   Hemoglobin 12.9 (*) 13.0 - 17.0 g/dL   HCT 41.0  39.0 - 52.0 %   MCV 104.3 (*) 78.0 - 100.0 fL   MCH 32.8  26.0 - 34.0 pg   MCHC 31.5  30.0 - 36.0 g/dL   RDW 12.4  11.5 - 15.5 %   Platelets 92 (*) 150 - 400 K/uL   Comment: PLATELET COUNT CONFIRMED BY SMEAR   Neutrophils Relative % 36 (*) 43 - 77 %   Neutro Abs 3.0  1.7 - 7.7 K/uL   Lymphocytes Relative 57 (*) 12 - 46 %   Lymphs Abs 4.7 (*) 0.7 - 4.0 K/uL   Monocytes Relative 4  3 - 12 %   Monocytes Absolute 0.3  0.1 - 1.0 K/uL   Eosinophils Relative 2  0 - 5 %   Eosinophils Absolute 0.2  0.0 - 0.7 K/uL   Basophils Relative 1  0 - 1 %  Basophils Absolute 0.0  0.0 - 0.1 K/uL  BASIC METABOLIC PANEL     Status: Abnormal   Collection Time    12/21/13  1:40 AM      Result Value Ref Range   Sodium 145  137 - 147 mEq/L   Potassium 4.2  3.7 - 5.3 mEq/L   Chloride 104  96 - 112 mEq/L   CO2 15 (*) 19 - 32 mEq/L   Glucose, Bld 392 (*) 70 - 99 mg/dL   BUN 10  6 - 23 mg/dL   Creatinine, Ser 1.02  0.50 - 1.35 mg/dL   Calcium 8.0 (*) 8.4 - 10.5 mg/dL   GFR calc non Af Amer 80 (*) >90 mL/min   GFR calc Af Amer >90  >90 mL/min   Comment: (NOTE)     The eGFR has been calculated using the CKD EPI equation.     This calculation has not been validated in all clinical situations.     eGFR's persistently <90 mL/min signify possible Chronic Kidney     Disease.  APTT     Status: Abnormal   Collection Time    12/21/13  1:40 AM      Result Value Ref Range   aPTT 38 (*) 24 - 37 seconds   Comment:            IF BASELINE aPTT IS  ELEVATED,     SUGGEST PATIENT RISK ASSESSMENT     BE USED TO DETERMINE APPROPRIATE     ANTICOAGULANT THERAPY.  PROTIME-INR     Status: Abnormal   Collection Time    12/21/13  1:40 AM      Result Value Ref Range   Prothrombin Time 16.5 (*) 11.6 - 15.2 seconds   INR 1.37  0.00 - 1.49  I-STAT ARTERIAL BLOOD GAS, ED     Status: Abnormal   Collection Time    12/21/13  1:43 AM      Result Value Ref Range   pH, Arterial 7.013 (*) 7.350 - 7.450   pCO2 arterial 61.5 (*) 35.0 - 45.0 mmHg   pO2, Arterial 316.0 (*) 80.0 - 100.0 mmHg   Bicarbonate 16.0 (*) 20.0 - 24.0 mEq/L   TCO2 18  0 - 100 mmol/L   O2 Saturation 100.0     Acid-base deficit 16.0 (*) 0.0 - 2.0 mmol/L   Patient temperature 35.6 C     Collection site RADIAL, ALLEN'S TEST ACCEPTABLE     Sample type ARTERIAL     Comment NOTIFIED PHYSICIAN    I-STAT TROPOININ, ED     Status: Abnormal   Collection Time    12/21/13  1:48 AM      Result Value Ref Range   Troponin i, poc 0.19 (*) 0.00 - 0.08 ng/mL   Comment NOTIFIED PHYSICIAN     Comment 3            Comment: Due to the release kinetics of cTnI,     a negative result within the first hours     of the onset of symptoms does not rule out     myocardial infarction with certainty.     If myocardial infarction is still suspected,     repeat the test at appropriate intervals.  I-STAT CG4 LACTIC ACID, ED     Status: Abnormal   Collection Time    12/21/13  1:50 AM      Result Value Ref Range   Lactic Acid, Venous 8.94 (*) 0.5 -  2.2 mmol/L    Ct Head Wo Contrast  12/21/2013   CLINICAL DATA:  Cardiac arrest.  EXAM: CT HEAD WITHOUT CONTRAST  TECHNIQUE: Contiguous axial images were obtained from the base of the skull through the vertex without contrast.  COMPARISON:  None  FINDINGS: Motion degraded exam due to involuntary jerking.  No evidence for acute infarction, hemorrhage, mass lesion, hydrocephalus, or extra-axial fluid. Normal for age cerebral volume. No white matter disease.  Calvarium intact. Chronic sinus inflammation. No mastoid fluid. Grossly negative orbits.  IMPRESSION: No acute intracranial abnormality.   Electronically Signed   By: Darryl Mays M.D.   On: 12/21/2013 03:00   Dg Chest Portable 1 View  12/21/2013   CLINICAL DATA:  Cardiac arrest.  Central line placement.  EXAM: PORTABLE CHEST - 1 VIEW  COMPARISON:  Portable film at 0157 hr.  FINDINGS: Unchanged ET tube and orogastric tube.  Left IJ central venous line has been inserted and lies with its tip in the mid SVC. There is no pneumothorax. Unchanged mild vascular congestion.  IMPRESSION: Left IJ central venous line satisfactory position.  No pneumothorax.   Electronically Signed   By: Darryl Mays M.D.   On: 12/21/2013 02:37   Dg Chest Port 1 View  12/21/2013   CLINICAL DATA:  Post code.  Post intubation.  EXAM: PORTABLE CHEST - 1 VIEW  COMPARISON:  None.  FINDINGS: Cardiac enlargement. Mild vascular congestion. ET tube 7.3 cm above carina. No pneumothorax. No focal areas of consolidation. Orogastric tube tip in the stomach. No definite fusion.  IMPRESSION: Status post intubation. The ET tube tip slightly higher at the level of the clavicles could be advanced 2-3 cm. Mild vascular congestion.   Electronically Signed   By: Darryl Mays M.D.   On: 12/21/2013 02:13    Review of Systems  Unable to perform ROS: critical illness   Blood pressure 129/87, pulse 100, temperature 96.1 F (35.6 C), temperature source Temporal, resp. rate 18, weight 77.111 kg (170 lb), SpO2 100.00%. Physical Exam  Nursing note and vitals reviewed. Constitutional: He appears well-developed and well-nourished. He appears distressed.  intubated  HENT:  ETT in place  Eyes: No scleral icterus.  Neck: No JVD present. No tracheal deviation present.  Left IJ in place  Cardiovascular: Normal rate, regular rhythm, normal heart sounds and intact distal pulses.   No murmur heard. GI: Soft. He exhibits no distension. There is no tenderness.   Hypoactive BS  Neurological:  Unable to assess, intubated  Skin: He is not diaphoretic.  cool  labs reviewed; wbc 8.2, h/h 12.9/41, na 145, K 4.2, bun/cr 10/1.02, glucose 392, calcium 8, trop 0.19, lactate 8.94, pH 7.01/62/316  Problem List Out of Hospital Cardiac Arrest Ventricular Fibrillation  Atrial fibrillation RBBB Metabolic acidosis  Elevated Troponin Elevated lactate  Respiratory Failure   Assessment/Plan: Mr. Etheredge is a 58 yo man with unknown PMH who was found unresponsive after being with friends/co-workers approximately 20 minutes previously. Differential diagnosis is very broad, although Mr. Werth is young (66) including primary respiratory arrest, cardiac arrest related to acute myocardial infarction, rare coronary anomalies, arrhythmias (short/long QT, CPVT, others), cardiomyopathy. I have spoken with my interventional attending (Dr. Irish Lack) who feels that the significantly elevated lactate, likely prolonged ROSC and lack of ST elevation are very poor prognostic factors. However, Pulmonary/Critical Care would like to pursue urgent cardiac catherization; so we are trying to arrange for echocardiogram to assess for viable EF or focal wall motion abnormality to inform further  decision-making.  - Cooling protocol - Watch for arrhythmia - no amiodarone for now, low threshold to restart - Official Echo read to follow: brief read with EF 20-25%, apex is akinetic, global hypokinesis throughout otherwise, preserved RV function, IVC collapses - Defer Cardiac Catheterization after team discussion with Interventional Cardiology and Pulmonary Critical Care for now given above findings and clinical scenario   Zong Mcquarrie 12/21/2013, 3:09 AM

## 2013-12-21 NOTE — Progress Notes (Signed)
Utilization Review Completed.Darryl Mays T6/06/2014  

## 2013-12-21 NOTE — Progress Notes (Signed)
ANTICOAGULATION CONSULT NOTE - Initial Consult  Pharmacy Consult for aggrastat Indication: chest pain/ACS/code stemi  No Known Allergies  Patient Measurements: Height: 6\' 1"  (185.4 cm) Weight: 170 lb 13.7 oz (77.5 kg) (no pads) IBW/kg (Calculated) : 79.9  Vital Signs: Temp: 90.1 F (32.3 C) (06/12 1400) Temp src: Core (Comment) (06/12 1400) BP: 86/62 mmHg (06/12 1300) Pulse Rate: 63 (06/12 1410)  Labs:  Recent Labs  12/21/13 0140 12/21/13 0430 12/21/13 0445 12/21/13 0500 12/21/13 0650 12/21/13 0824 12/21/13 0900 12/21/13 1039 12/21/13 1232  HGB 12.9* 14.6  --   --   --   --  14.9  --   --   HCT 41.0 43.2  --   --   --   --  43.9  --   --   PLT 92* 149*  --   --   --   --  PLATELET CLUMPS NOTED ON SMEAR, COUNT APPEARS DECREASED  --   --   APTT 38*  --   --  31  --   --   --  32  --   LABPROT 16.5*  --   --  15.0  --   --   --  14.1  --   INR 1.37  --   --  1.21  --   --   --  1.11  --   CREATININE 1.02 0.83  --  0.63 0.70  --   --  0.68 0.65  TROPONINI  --   --  8.87*  --   --  16.46*  --   --   --     Estimated Creatinine Clearance: 113 ml/min (by C-G formula based on Cr of 0.65).   Medical History: History reviewed. No pertinent past medical history.   Assessment: 58 year old vfib arrest on hypothermia protocol. Noted EKG changes with elevated CE's. Code stemi called and patient taken to cath lab for emergent cath/pci to RCA. Aggrastat ordered to be continued until patient is warmed. Brilinta has also been started post cath. Renal function normal. Last plt count was 149, will follow daily cbc.  Goal of Therapy:   Monitor platelets by anticoagulation protocol: Yes   Plan:  Aggrastat 0.3515mcg/kg/min until rewarmed Daily cbc  Severiano GilbertWilson, Kaitlynn Tramontana Rhea 12/21/2013,3:54 PM

## 2013-12-21 NOTE — Progress Notes (Addendum)
STAT EKG obtained after noticing ST elevation on monitor. Recent EKG now showing ST elevation in lead III and registering as a Acute STEMI. Dr Rennis GoldenHilty notified. Instructed to walk EKG to cath lab and let the on call Intervention Dr to read EKG.   Dawson BillsKim Makana Feigel, RN

## 2013-12-21 NOTE — Progress Notes (Signed)
Echocardiogram 2D Echocardiogram has been performed.  Darryl Mays, Darryl Mays M 12/21/2013, 4:37 AM

## 2013-12-21 NOTE — Progress Notes (Signed)
CODE Stemi called; getting patient ready to transfer to cath lab.  Dawson BillsKim Ellwood Steidle, RN

## 2013-12-21 NOTE — ED Provider Notes (Signed)
CSN: [id number]     Arrival date & time 12/21/13  0126 History   First MD Initiated Contact with Patient 12/21/13 0143     Chief Complaint  Patient presents with  . Cardiac Arrest     (Consider location/radiation/quality/duration/timing/severity/associated sxs/prior Treatment) The history is provided by the EMS personnel. The history is limited by the condition of the patient (Unresponsive).   58 year old male was brought in by EMS following CPR. He was in a restaurant and went into the bathroom and friends noted that he had not come out after 20 minutes and he was found unresponsive. He EMS got the call at 12:45 AM and arrived at 12:50 AM and began CPR. Initial rhythm was ventricular fibrillation. IV lines were started and he was intubated and he was defibrillated without success. He was given epinephrine and amiodarone and then was defibrillated successfully. He was transported with a stable rhythm which deteriorated to ventricular fibrillation at about the time they arrived in the ED. No further history is available.  History reviewed. No pertinent past medical history. History reviewed. No pertinent past surgical history. No family history on file. History  Substance Use Topics  . Smoking status: Not on file  . Smokeless tobacco: Not on file  . Alcohol Use: Not on file    Review of Systems  Unable to perform ROS: Intubated      Allergies  Review of patient's allergies indicates no known allergies.  Home Medications   Prior to Admission medications   Not on File   BP 176/105  Temp(Src) 96.1 F (35.6 C) (Temporal)  Resp 13  Wt 170 lb (77.111 kg)  SpO2 100% Physical Exam  Nursing note and vitals reviewed.  58 year old male, intubated with CPR being done. Head is normocephalic and atraumatic. Pupils are 4 mm and nonreactive. Face and neck are somewhat cyanotic. Lungs have symmetric air movement. Chest has mild traumatic pectus excavatum. Heart tones are  absent. Abdomen is soft, flat. Extremities have no cyanosis or edema. Venous stasis changes are noted. Skin is warm and dry without rash. Neurologic: GCS=3.  ED Course  Procedures (including critical care time) Labs Review Results for orders placed during the hospital encounter of 12/21/13  MRSA PCR SCREENING      Result Value Ref Range   MRSA by PCR NEGATIVE  NEGATIVE  CBC WITH DIFFERENTIAL      Result Value Ref Range   WBC 8.2  4.0 - 10.5 K/uL   RBC 3.93 (*) 4.22 - 5.81 MIL/uL   Hemoglobin 12.9 (*) 13.0 - 17.0 g/dL   HCT 40.9  81.1 - 91.4 %   MCV 104.3 (*) 78.0 - 100.0 fL   MCH 32.8  26.0 - 34.0 pg   MCHC 31.5  30.0 - 36.0 g/dL   RDW 78.2  95.6 - 21.3 %   Platelets 92 (*) 150 - 400 K/uL   Neutrophils Relative % 36 (*) 43 - 77 %   Neutro Abs 3.0  1.7 - 7.7 K/uL   Lymphocytes Relative 57 (*) 12 - 46 %   Lymphs Abs 4.7 (*) 0.7 - 4.0 K/uL   Monocytes Relative 4  3 - 12 %   Monocytes Absolute 0.3  0.1 - 1.0 K/uL   Eosinophils Relative 2  0 - 5 %   Eosinophils Absolute 0.2  0.0 - 0.7 K/uL   Basophils Relative 1  0 - 1 %   Basophils Absolute 0.0  0.0 - 0.1 K/uL  BASIC METABOLIC  PANEL      Result Value Ref Range   Sodium 145  137 - 147 mEq/L   Potassium 4.2  3.7 - 5.3 mEq/L   Chloride 104  96 - 112 mEq/L   CO2 15 (*) 19 - 32 mEq/L   Glucose, Bld 392 (*) 70 - 99 mg/dL   BUN 10  6 - 23 mg/dL   Creatinine, Ser 1.611.02  0.50 - 1.35 mg/dL   Calcium 8.0 (*) 8.4 - 10.5 mg/dL   GFR calc non Af Amer 80 (*) >90 mL/min   GFR calc Af Amer >90  >90 mL/min  APTT      Result Value Ref Range   aPTT 38 (*) 24 - 37 seconds  PROTIME-INR      Result Value Ref Range   Prothrombin Time 16.5 (*) 11.6 - 15.2 seconds   INR 1.37  0.00 - 1.49  CBC      Result Value Ref Range   WBC 9.0  4.0 - 10.5 K/uL   RBC 4.27  4.22 - 5.81 MIL/uL   Hemoglobin 14.6  13.0 - 17.0 g/dL   HCT 09.643.2  04.539.0 - 40.952.0 %   MCV 101.2 (*) 78.0 - 100.0 fL   MCH 34.2 (*) 26.0 - 34.0 pg   MCHC 33.8  30.0 - 36.0 g/dL   RDW  81.112.4  91.411.5 - 78.215.5 %   Platelets 149 (*) 150 - 400 K/uL  TROPONIN I      Result Value Ref Range   Troponin I 8.87 (*) <0.30 ng/mL  BASIC METABOLIC PANEL      Result Value Ref Range   Sodium 146  137 - 147 mEq/L   Potassium 4.0  3.7 - 5.3 mEq/L   Chloride 107  96 - 112 mEq/L   CO2 22  19 - 32 mEq/L   Glucose, Bld 241 (*) 70 - 99 mg/dL   BUN 12  6 - 23 mg/dL   Creatinine, Ser 9.560.83  0.50 - 1.35 mg/dL   Calcium 7.5 (*) 8.4 - 10.5 mg/dL   GFR calc non Af Amer >90  >90 mL/min   GFR calc Af Amer >90  >90 mL/min  BASIC METABOLIC PANEL      Result Value Ref Range   Sodium 149 (*) 137 - 147 mEq/L   Potassium 3.2 (*) 3.7 - 5.3 mEq/L   Chloride 112  96 - 112 mEq/L   CO2 22  19 - 32 mEq/L   Glucose, Bld 162 (*) 70 - 99 mg/dL   BUN 14  6 - 23 mg/dL   Creatinine, Ser 2.130.70  0.50 - 1.35 mg/dL   Calcium 7.8 (*) 8.4 - 10.5 mg/dL   GFR calc non Af Amer >90  >90 mL/min   GFR calc Af Amer >90  >90 mL/min  URINALYSIS, ROUTINE W REFLEX MICROSCOPIC      Result Value Ref Range   Color, Urine YELLOW  YELLOW   APPearance CLEAR  CLEAR   Specific Gravity, Urine 1.022  1.005 - 1.030   pH 6.0  5.0 - 8.0   Glucose, UA NEGATIVE  NEGATIVE mg/dL   Hgb urine dipstick TRACE (*) NEGATIVE   Bilirubin Urine SMALL (*) NEGATIVE   Ketones, ur NEGATIVE  NEGATIVE mg/dL   Protein, ur 086100 (*) NEGATIVE mg/dL   Urobilinogen, UA 1.0  0.0 - 1.0 mg/dL   Nitrite NEGATIVE  NEGATIVE   Leukocytes, UA NEGATIVE  NEGATIVE  GLUCOSE, CAPILLARY      Result  Value Ref Range   Glucose-Capillary 297 (*) 70 - 99 mg/dL  URINE MICROSCOPIC-ADD ON      Result Value Ref Range   Squamous Epithelial / LPF RARE  RARE   RBC / HPF 3-6  <3 RBC/hpf   Bacteria, UA RARE  RARE  BLOOD GAS, ARTERIAL      Result Value Ref Range   FIO2 1.00     Delivery systems VENTILATOR     Mode PRESSURE REGULATED VOLUME CONTROL     VT 500     Rate 20     Peep/cpap 5.0     pH, Arterial 7.364  7.350 - 7.450   pCO2 arterial 41.2  35.0 - 45.0 mmHg   pO2,  Arterial 84.0  80.0 - 100.0 mmHg   Bicarbonate 23.5  20.0 - 24.0 mEq/L   TCO2 24.9  0 - 100 mmol/L   Acid-base deficit 1.5  0.0 - 2.0 mmol/L   O2 Saturation 96.4     Patient temperature 95.4     Collection site A-LINE     Drawn by 10006     Sample type ARTERIAL     Allens test (pass/fail) NOT INDICATED (*) PASS  GLUCOSE, CAPILLARY      Result Value Ref Range   Glucose-Capillary 220 (*) 70 - 99 mg/dL  GLUCOSE, CAPILLARY      Result Value Ref Range   Glucose-Capillary 213 (*) 70 - 99 mg/dL   Comment 1 Notify RN    GLUCOSE, CAPILLARY      Result Value Ref Range   Glucose-Capillary 145 (*) 70 - 99 mg/dL  GLUCOSE, CAPILLARY      Result Value Ref Range   Glucose-Capillary 148 (*) 70 - 99 mg/dL  GLUCOSE, CAPILLARY      Result Value Ref Range   Glucose-Capillary 137 (*) 70 - 99 mg/dL  I-STAT CG4 LACTIC ACID, ED      Result Value Ref Range   Lactic Acid, Venous 8.94 (*) 0.5 - 2.2 mmol/L  I-STAT ARTERIAL BLOOD GAS, ED      Result Value Ref Range   pH, Arterial 7.013 (*) 7.350 - 7.450   pCO2 arterial 61.5 (*) 35.0 - 45.0 mmHg   pO2, Arterial 316.0 (*) 80.0 - 100.0 mmHg   Bicarbonate 16.0 (*) 20.0 - 24.0 mEq/L   TCO2 18  0 - 100 mmol/L   O2 Saturation 100.0     Acid-base deficit 16.0 (*) 0.0 - 2.0 mmol/L   Patient temperature 35.6 C     Collection site RADIAL, ALLEN'S TEST ACCEPTABLE     Sample type ARTERIAL     Comment NOTIFIED PHYSICIAN    I-STAT TROPOININ, ED      Result Value Ref Range   Troponin i, poc 0.19 (*) 0.00 - 0.08 ng/mL   Comment NOTIFIED PHYSICIAN     Comment 3            Imaging Review Ct Head Wo Contrast  12/21/2013   CLINICAL DATA:  Cardiac arrest.  EXAM: CT HEAD WITHOUT CONTRAST  TECHNIQUE: Contiguous axial images were obtained from the base of the skull through the vertex without contrast.  COMPARISON:  None  FINDINGS: Motion degraded exam due to involuntary jerking.  No evidence for acute infarction, hemorrhage, mass lesion, hydrocephalus, or extra-axial  fluid. Normal for age cerebral volume. No white matter disease. Calvarium intact. Chronic sinus inflammation. No mastoid fluid. Grossly negative orbits.  IMPRESSION: No acute intracranial abnormality.   Electronically Signed   By:  Davonna Belling M.D.   On: 12/21/2013 03:00   Dg Chest Port 1 View  12/21/2013   CLINICAL DATA:  Endotracheal tube placement.  EXAM: PORTABLE CHEST - 1 VIEW  COMPARISON:  12/21/2013  FINDINGS: Endotracheal tube tip measures 5.9 cm above the carinal. Enteric tube tip is off the field of view but below the left hemidiaphragm. Left central venous catheter is unchanged in position. Heart size and pulmonary vascularity are normal for technique. Interval development of linear atelectasis or infiltration in the right lung base. No pneumothorax.  IMPRESSION: Appliances appear in satisfactory location. Developing atelectasis or infiltration in the right lung base.   Electronically Signed   By: Burman Nieves M.D.   On: 12/21/2013 04:27   Dg Chest Portable 1 View  12/21/2013   CLINICAL DATA:  Cardiac arrest.  Central line placement.  EXAM: PORTABLE CHEST - 1 VIEW  COMPARISON:  Portable film at 0157 hr.  FINDINGS: Unchanged ET tube and orogastric tube.  Left IJ central venous line has been inserted and lies with its tip in the mid SVC. There is no pneumothorax. Unchanged mild vascular congestion.  IMPRESSION: Left IJ central venous line satisfactory position.  No pneumothorax.   Electronically Signed   By: Davonna Belling M.D.   On: 12/21/2013 02:37   Dg Chest Port 1 View  12/21/2013   CLINICAL DATA:  Post code.  Post intubation.  EXAM: PORTABLE CHEST - 1 VIEW  COMPARISON:  None.  FINDINGS: Cardiac enlargement. Mild vascular congestion. ET tube 7.3 cm above carina. No pneumothorax. No focal areas of consolidation. Orogastric tube tip in the stomach. No definite fusion.  IMPRESSION: Status post intubation. The ET tube tip slightly higher at the level of the clavicles could be advanced 2-3 cm.  Mild vascular congestion.   Electronically Signed   By: Davonna Belling M.D.   On: 12/21/2013 02:13    Date: 12/21/2013  Rate: 75  Rhythm: atrial fibrillation  QRS Axis: normal  Intervals: normal  ST/T Wave abnormalities: nonspecific ST changes  Conduction Disutrbances:right bundle branch block  Narrative Interpretation: Atrial fibrillation, right palmar branch block, nonspecific ST changes. No prior ECG available for comparison.  Old EKG Reviewed: none available  Cardiopulmonary Resuscitation (CPR) Procedure Note Directed/Performed by: Dione Booze I personally directed ancillary staff and/or performed CPR in an effort to regain return of spontaneous circulation and to maintain cardiac, neuro and systemic perfusion.   CRITICAL CARE Performed by: Dione Booze Total critical care time: 45 minutes Critical care time was exclusive of separately billable procedures and treating other patients. Critical care was necessary to treat or prevent imminent or life-threatening deterioration. Critical care was time spent personally by me on the following activities: development of treatment plan with patient and/or surrogate as well as nursing, discussions with consultants, evaluation of patient's response to treatment, examination of patient, obtaining history from patient or surrogate, ordering and performing treatments and interventions, ordering and review of laboratory studies, ordering and review of radiographic studies, pulse oximetry and re-evaluation of patient's condition.  MDM   Final diagnoses:  Cardiac arrest  Encephalopathy acute  Ventricular fibrillation  Acute respiratory failure  Acute respiratory failure with hypoxia    Patient with out of hospital arrest arrived in ventricular fibrillation. He was defibrillated twice to a supraventricular rhythm which appeared to be atrial fibrillation. Pulse was palpable but no blood pressure was able to be obtained. Written slowed to 42 beats per  minute and he was given atropine with improvement in heart rate.  Blood pressure was initially unobtainable but he then was identified and 60 systolic. He was placed on norepinephrine drip without significant change in blood pressure. ABG was obtained showing significant acidosis with pH of 7.01. Lactic acid is also markedly elevated at 8.94. He was given sodium bicarbonate intravenously and blood pressure came up dramatically. Critical care has been consulted for her management of an out of hospital cardiac arrest. Cardiology has been consulted as well. Cooling protocol has been initiated. ECG does not show evidence of STEMI although initial troponin is mildly elevated. It is felt that he most likely had a non-STEMI as a precipitating factor for his cardiac arrest. He is admitted to ICU.  Dione Booze, MD 12/21/13 [phone]

## 2013-12-21 NOTE — Progress Notes (Signed)
EKG CRITICAL VALUE     12 lead EKG performed.  Critical value noted. ** Deanna Revis,  RN notified.**   Safira Proffit, CCT 12/21/2013 1:46 PM

## 2013-12-21 NOTE — Progress Notes (Signed)
eICU physician Dr. Marin ShutterZubelevitskiy made aware patient's right hand increasingly mottled with dusky appearance. Appropriate waveform via right radial arterial line and weak but palpable right radial pulse noted. Dr. Marin ShutterZubelevitskiy made aware unable to doppler a right ulnar pulse at this time. Orders received to remove right radial arterial line. See DocFlowsheets for additional assessment findings. Will continue to assess and monitor patient closely.

## 2013-12-21 NOTE — H&P (View-Only) (Signed)
Seen and briefly examined. I reviewed the echocardiogram - EF appears to be 25-30%, there is global hypokinesis with regional variation and apical akinesis. Apical thrombus cannot be excluded. Troponin elevated to 8.8. Currently on artic sun protocol - BP stable with MAP 80, no pressors, no evidence for further VF. Hypokalemic this am to 3.2. May shift with re-warming, but would give 40 MEQ k+ now. No plan to go to cath lab unless new dynamic EKG changes, until after rewarming and signs of neurologic improvement.  Kenneth C. Hilty, MD, FACC Attending Cardiologist [facility]  

## 2013-12-21 NOTE — ED Notes (Signed)
i-stat CG4+ result given to Dr. Preston FleetingGlick

## 2013-12-21 NOTE — Code Documentation (Addendum)
Critical care and cardiology at bedside.

## 2013-12-21 NOTE — H&P (Signed)
PULMONARY / CRITICAL CARE MEDICINE   Name: Darryl Mays MRN: [id number] DOB: 01/02/1956    ADMISSION DATE:  12/21/2013 CONSULTATION DATE:  12/21/2013  REFERRING MD :  EDP PRIMARY SERVICE: PCCM  CHIEF COMPLAINT:  V-Fib Arrest  BRIEF PATIENT DESCRIPTION: 58 y.o. M with unknown PMH, brought to ED after suffering Vfib arrest.  In field received 4 shocks + 3 while in ED.  PCCM was consulted for initiation of hypothermia protocol / admission.  SIGNIFICANT EVENTS / STUDIES:  6/12 - suffered out of hospital vfib arrest. 6/12 Head CT >>> negative.  LINES / TUBES: OETT 6/12 >>> OGT 6/12 >>> Foley 6/12 >>> Left IJ CVL 6/12 >>> ? A-line (pending) 6/12 >>>  CULTURES: None  ANTIBIOTICS: None  HISTORY OF PRESENT ILLNESS:  Pt is encephalopathic; therefore, this HPI is obtained from chart review. Darryl Mays is a 58 y.o. M with unknown PMH who suffered a vfib arrest early AM 6/12.  Per EMS report, pt works at [employer] and while at work [employer], went to use the restroom.  After he didn't return 20 minutes later, co-workers went to look for him and found him in restroom unresponsive.  EMS was dispatched and upon their arrival, pt was in V-Fib.  He was intubated, shocked 4 times and given amio + epi before ROSC.  Maintained stable rhythm en route to ED.  In ED, pt converted back to V-fib requiring 3 shocks.  He then became bradycardic into 40's requiring 1 dose atropine.  Initial ABG showed pH of 7.01, pt was given 2 amps bicarb.   PAST MEDICAL HISTORY :  History reviewed. No pertinent past medical history. History reviewed. No pertinent past surgical history. Prior to Admission medications   Not on File   No Known Allergies  FAMILY HISTORY:  No family history on file. SOCIAL HISTORY:  has no tobacco, alcohol, and drug history on file.  REVIEW OF SYSTEMS:  Unable to complete as pt is encephalopathic.   SUBJECTIVE:    VITAL SIGNS: Temp:  [96.1 F (35.6 C)] 96.1 F (35.6 C) (06/12  0141) Pulse Rate:  [100-116] 100 (06/12 0251) Resp:  [13-23] 18 (06/12 0251) BP: (61-176)/(41-112) 129/87 mmHg (06/12 0251) SpO2:  [98 %-100 %] 100 % (06/12 0251) FiO2 (%):  [50 %] 50 % (06/12 0200) Weight:  [77.111 kg (170 lb)] 77.111 kg (170 lb) (06/12 0153) HEMODYNAMICS:   VENTILATOR SETTINGS: Vent Mode:  [-] PRVC FiO2 (%):  [50 %] 50 % Set Rate:  [14 bmp] 14 bmp Vt Set:  [500 mL] 500 mL PEEP:  [5 cmH20] 5 cmH20 Plateau Pressure:  [8 cmH20] 8 cmH20 INTAKE / OUTPUT: Intake/Output   None     PHYSICAL EXAMINATION: General: Ill appearing male appears older than stated age, unresponsive in stretcher s/p v.fib arrest. Neuro: GCS 3.  Has myoclonus. HEENT: [place]/AT. PERRL, sclerae anicteric. Cardiovascular: RRR, no M/R/G.  Lungs: Respirations even and unlabored on full vent support. Scattered rhonchi. Abdomen: BS x 4, soft, NT/ND.  Musculoskeletal: No gross deformities, no edema.  Skin: Intact, warm, no rashes.    LABS:  CBC  Recent Labs Lab 12/21/13 0140  WBC 8.2  HGB 12.9*  HCT 41.0  PLT 92*   Coag's  Recent Labs Lab 12/21/13 0140  APTT 38*  INR 1.37   BMET  Recent Labs Lab 12/21/13 0140  NA 145  K 4.2  CL 104  CO2 15*  BUN 10  CREATININE 1.02  GLUCOSE 392*   Electrolytes  Recent  Labs Lab 12/21/13 0140  CALCIUM 8.0*   Sepsis Markers  Recent Labs Lab 12/21/13 0150  LATICACIDVEN 8.94*   ABG  Recent Labs Lab 12/21/13 0143  PHART 7.013*  PCO2ART 61.5*  PO2ART 316.0*   Liver Enzymes No results found for this basename: AST, ALT, ALKPHOS, BILITOT, ALBUMIN,  in the last 168 hours Cardiac Enzymes No results found for this basename: TROPONINI, PROBNP,  in the last 168 hours Glucose No results found for this basename: GLUCAP,  in the last 168 hours  Imaging Ct Head Wo Contrast  12/21/2013   CLINICAL DATA:  Cardiac arrest.  EXAM: CT HEAD WITHOUT CONTRAST  TECHNIQUE: Contiguous axial images were obtained from the base of the skull through  the vertex without contrast.  COMPARISON:  None  FINDINGS: Motion degraded exam due to involuntary jerking.  No evidence for acute infarction, hemorrhage, mass lesion, hydrocephalus, or extra-axial fluid. Normal for age cerebral volume. No white matter disease. Calvarium intact. Chronic sinus inflammation. No mastoid fluid. Grossly negative orbits.  IMPRESSION: No acute intracranial abnormality.   Electronically Signed   By: Davonna BellingJohn  Curnes M.D.   On: 12/21/2013 03:00   Dg Chest Portable 1 View  12/21/2013   CLINICAL DATA:  Cardiac arrest.  Central line placement.  EXAM: PORTABLE CHEST - 1 VIEW  COMPARISON:  Portable film at 0157 hr.  FINDINGS: Unchanged ET tube and orogastric tube.  Left IJ central venous line has been inserted and lies with its tip in the mid SVC. There is no pneumothorax. Unchanged mild vascular congestion.  IMPRESSION: Left IJ central venous line satisfactory position.  No pneumothorax.   Electronically Signed   By: Davonna BellingJohn  Curnes M.D.   On: 12/21/2013 02:37   Dg Chest Port 1 View  12/21/2013   CLINICAL DATA:  Post code.  Post intubation.  EXAM: PORTABLE CHEST - 1 VIEW  COMPARISON:  None.  FINDINGS: Cardiac enlargement. Mild vascular congestion. ET tube 7.3 cm above carina. No pneumothorax. No focal areas of consolidation. Orogastric tube tip in the stomach. No definite fusion.  IMPRESSION: Status post intubation. The ET tube tip slightly higher at the level of the clavicles could be advanced 2-3 cm. Mild vascular congestion.   Electronically Signed   By: Davonna BellingJohn  Curnes M.D.   On: 12/21/2013 02:13     ASSESSMENT / PLAN:  PULMONARY A: VDRF - in setting of V.Fib arrest. P:   - Full mechanical support, wean as able. - VAP bundle. - ABG and CXR in AM.  CARDIOVASCULAR A:  S/p V.Fib arrest (total CPR time "brief", shocked 7 times) Afib? P:  - Initiate hypothermia protocol. - Cards consult, have requested STAT echo to evaluate for RWMA before taking to cath lab. - STAT echo. -  Levophed for goal MAP > 85 while cooling. - Trend troponin / lactate. - Check cortisol. - Amiodarone/anti-arrhythmics per cards  RENAL A:   AG metabolic acidosis - lactate P:   - NS @ 100. - BMP q2hrs.  GASTROINTESTINAL A:   Nutrition P:   - NPO as intubated. - Nutrition consult for TF's. - SUP: pantoprazole.  HEMATOLOGIC A:  No acute issues. P:  - VTE Proph:  Heparin / SCD's. - CBC in AM.  INFECTIOUS A:   No acute issues. P:   - Cultures and antibiotics as above, narrow abx as cultures result. - PCT Algorithm.  ENDOCRINE A:   Hyperglycemia - no known hx of DM.  P:   - ICU hyperglycemia protocol. - Check  cortisol.  NEUROLOGIC A:   Acute encephalopathy P:   - Sedation:  Versed / Fentanyl. - Nimbex. - Goal RASS -2. - Hold daily WUA.   Rutherford Guys, PA - [facility] Pgr: [phone]  or [phone]  Attending:  I have seen and examined the patient with nurse practitioner/resident and agree with the note above.   58 y/o male with unknown PMH had sudden cardiac arrest this evening of uncertain etiology.  Nothing on physical exam or other objective data to suggest cause (no choking, pneumonia, trauma, CNS event), so worrisome for either CAD or baseline cardiomyopathy or rhythm disturbance (prolonged QT, etc).    Maximum down time estimated to be 25 minutes.  Myoclonus looks worrisome for anoxic brain injury.  Initiate hypothermia  F/u echocardiogram results and cath timing with cardiology  I have personally obtained a history, examined the patient, evaluated laboratory and imaging results, formulated the assessment and plan and placed orders. CRITICAL CARE: The patient is critically ill with multiple organ systems failure and requires high complexity decision making for assessment and support, frequent evaluation and titration of therapies, application of advanced monitoring technologies and extensive  interpretation of multiple databases. Critical Care Time devoted to patient care services described in this note is 45 minutes.   Heber Bondurant, MD [place] [facility] Pager: [phone] Cell: [phone] If no response, call [phone]   12/21/2013, 3:18 AM

## 2013-12-21 NOTE — Progress Notes (Signed)
Seen and briefly examined. I reviewed the echocardiogram - EF appears to be 25-30%, there is global hypokinesis with regional variation and apical akinesis. Apical thrombus cannot be excluded. Troponin elevated to 8.8. Currently on artic sun protocol - BP stable with MAP 80, no pressors, no evidence for further VF. Hypokalemic this am to 3.2. May shift with re-warming, but would give 40 MEQ k+ now. No plan to go to cath lab unless new dynamic EKG changes, until after rewarming and signs of neurologic improvement.  Chrystie NoseKenneth C. Echo Allsbrook, MD, [facility] Attending Cardiologist [facility]

## 2013-12-21 NOTE — Progress Notes (Signed)
Chaplain responded to ED page for CPR in progress. I was accompanied by Darryl Mays. Pt's pulse was restored. No family was present. [facility] When we were there pt's address or any family information was unknown. I asked that we be called if any family arrives.

## 2013-12-21 NOTE — Procedures (Signed)
Central Venous Catheter Insertion Procedure Note Darryl MulchJohn Mcinerney [id number] 06/10/1956  Procedure: Insertion of Central Venous Catheter Indications: Assessment of intravascular volume, Drug and/or fluid administration and Frequent blood sampling  Procedure Details Consent: Unable to obtain consent because of emergent medical necessity. Time Out: Verified patient identification, verified procedure, site/side was marked, verified correct patient position, special equipment/implants available, medications/allergies/relevent history reviewed, required imaging and test results available.  Performed  Maximum sterile technique was used including antiseptics, cap, gloves, gown, hand hygiene, mask and sheet. Skin prep: Chlorhexidine; local anesthetic administered A antimicrobial bonded/coated triple lumen catheter was placed in the left internal jugular vein using the Seldinger technique.  Evaluation Blood flow good Complications: No apparent complications Patient did tolerate procedure well. Chest X-ray ordered to verify placement.  CXR: pending.  Procedure performed under direct ultrasound guidance for real time vessel cannulation.      Rutherford Guysahul Desai, PA - [facility] Pgr: [phone]  or [phone]   Attending:  I was present for and supervised the procedure   Heber CarolinaBrent McQuaid, MD [facility] Pager: [phone] Cell: [phone] If no response, call [phone]

## 2013-12-21 NOTE — Progress Notes (Signed)
INITIAL NUTRITION ASSESSMENT  DOCUMENTATION CODES Per approved criteria  -Not Applicable   INTERVENTION: Once pt is re-warmed, if enteral nutrition is still warranted, recommend initiation of Vital AF 1.2 at 25 ml/hr. Advance by 10 ml q 4 hours, to goal of 65 ml/hr. Goal regimen will provide: 1872 kcal, 117 grams protein, 1265 ml free water. RD to continue to follow nutrition care plan.  NUTRITION DIAGNOSIS: Inadequate oral intake  related to inability to eat as evidenced by NPO status.   Goal: Intake to meet >90% of estimated nutrition needs.  Monitor:  weight trends, lab trends, I/O's, TF initiation/tolerance, vent status/settings  Reason for Assessment: MD Consult for Initiation/Management of TF  58 y.o. male  Admitting Dx: Cardiac arrest  ASSESSMENT: Patient with unknown PMHx. Admitted s/p vfib arrest, found down in restroom at [employer], where pt is currently employed.   Intubated early this morning. Placed on hypothermia protocol.  RD consulted to start enteral nutrition, however patient is continuing to be cooled and then will be re-warmed. Discussed with RN, will not initiate feeding until pt has been re-warmed.  Patient appears thin with moderate wasting in lower extremities and temples, RD did not complete full physical exam however, as RN was tending to patient during RD visit.  Patient is currently intubated on ventilator support. MV: 10.2 L/min Temp (24hrs), Avg:93.7 F (34.3 C), Min:90.7 F (32.6 C), Max:96.6 F (35.9 C)  Propofol: none  Sodium elevated at 149 Potassium low at 3.2 Magnesium WNL Phosphorus low at 1.7  Height: Ht Readings from Last 1 Encounters:  12/21/13 6\' 1"  (1.854 m)    Weight: Wt Readings from Last 1 Encounters:  12/21/13 170 lb 13.7 oz (77.5 kg)    Ideal Body Weight: 184 lb/83.6 kg  % Ideal Body Weight: 93%  Wt Readings from Last 10 Encounters:  12/21/13 170 lb 13.7 oz (77.5 kg)    Usual Body Weight: n/a  % Usual  Body Weight: n/a  BMI:  Body mass index is 22.55 kg/(m^2). WNL  Estimated Nutritional Needs (using 37 degrees C): Kcal: 1879 Protein: at least 95 g daily Fluid: 1.8 - 2 liters daily  Skin: intact  Diet Order: NPO  EDUCATION NEEDS: -No education needs identified at this time   Intake/Output Summary (Last 24 hours) at 12/21/13 1334 Last data filed at 12/21/13 0900  Gross per 24 hour  Intake 757.99 ml  Output    675 ml  Net  82.99 ml    Last BM: PTA  Labs:   Recent Labs Lab 12/21/13 0430 12/21/13 0500 12/21/13 0650 12/21/13 1039 12/21/13 1232  NA 146 148* 149* 149* 145  K 4.0 3.1* 3.2* 4.1 4.2  CL 107 114* 112 115* 113*  CO2 22 21 22 20  18*  BUN 12 14 14 17 18   CREATININE 0.83 0.63 0.70 0.68 0.65  CALCIUM 7.5* 7.0* 7.8* 7.5* 7.2*  MG  --  1.6  --   --   --   PHOS  --  1.7*  --   --   --   GLUCOSE 241* 114* 162* 116* 353*    CBG (last 3)   Recent Labs  12/21/13 0805 12/21/13 0905 12/21/13 1014  GLUCAP 137* 103* 104*    Scheduled Meds: . antiseptic oral rinse  15 mL Mouth Rinse QID  . artificial tears  1 application Both Eyes 3 times per day  . chlorhexidine  15 mL Mouth Rinse BID  . dextrose      .  heparin  5,000 Units Subcutaneous 3 times per day  . insulin aspart  2-6 Units Subcutaneous 6 times per day  . pantoprazole (PROTONIX) IV  40 mg Intravenous QHS    Continuous Infusions: . sodium chloride 100 mL/hr at 12/21/13 0800  . cisatracurium (NIMBEX) infusion 1 mcg/kg/min (12/21/13 0800)  . fentaNYL infusion INTRAVENOUS 150 mcg/hr (12/21/13 0800)  . midazolam (VERSED) infusion 4 mg/hr (12/21/13 0800)  . norepinephrine (LEVOPHED) Adult infusion      History reviewed. No pertinent past medical history.  History reviewed. No pertinent past surgical history.  Jarold MottoSamantha Dagoberto Nealy MS, RD, LDN Inpatient Registered Dietitian Pager: [phone] After-hours pager: [phone]

## 2013-12-21 NOTE — CV Procedure (Signed)
Left Heart Catheterization with Coronary Angiography and PCI RCA Report  Darryl MulchJohn Segers  58 y.o.  male 04/16/1956  Procedure Date: 12/21/2013 Referring Physician: [place]had Hilty, M.D. Primary Cardiologist:: Rennis GoldenHilty, M.D.  INDICATIONS: Inferior ST elevation in the post cardiac arrest patient who is on hypothermia protocol  PROCEDURE: 1. Left heart catheterization; 2. Coronary angiography; 3. Left ventriculography; 4. Bare-metal stent right coronary; 5. Multiple doses of intracoronary nitroglycerin  CONSENT:  The risks, benefits, and details of the procedure were explained in detail to the patient. Risks including death, stroke, heart attack, kidney injury, allergy, limb ischemia, bleeding and radiation injury were discussed.  The patient verbalized understanding and wanted to proceed.  Informed written consent was obtained.  PROCEDURE TECHNIQUE:  After Xylocaine anesthesia a 6 French sheath was placed in the right femoral artery using the modified Seldinger technique.  The right radial had an arterial line in place. The left radial is not palpable. The femoral artery was barely palpable, because of hypothermia and vasoconstriction Coronary angiography was done using a 5 F A2 MP and 3.5 cm left Judkins catheter.  Left ventriculography was done using the 5 French A2 MP catheter and hand injection.   After analyzing the digital data, multiple lesions were noted in the left and right coronary. The EKG suggested inferior injury. The left coronary demonstrated severe diffuse vasoconstriction with high grade obstruction in the mid LAD and both of 2 large diagonal branches. It also appeared to be ostial and proximal circumflex stenosis. The left coronary is a double ostial system.  We use a Judkins right catheter to engage the native right coronary which contained a subtotally occluded segmental stenosis as started in the proximal vessel and extended into the mid vessel. TIMI grade 2 flow was noted on  initial angiography. A bolus of bivalirudin followed by an infusion resulted in an ACT was greater than 300. We were unable to load the patient orally with P2 Y12 inhibitor. Late in the case we administered Aggrastat by bolus and then infusion.  PCI of the right coronary was performed using a JR 4 guide catheter and a Pro-water wire. Predilatation in the proximal and mid right coronary was performed with a 2.5 x 20 mm long balloon. 100 mcg of intracoronary nitroglycerin was administered into the right coronary demonstrating that the vessel was between 2.75 and 3.0 mm in diameter. We positioned and deployed a MultiLink vision 38 x 3.0 Bare-metal stent post dilated to 3.25 mm in diameter. TIMI grade 3 flow was noted. No significant complications occurred during the procedure.  We then turned our attention to the left coronary reason XB LAD 3.0 cm 6 [place]French guide catheter to cannulate the LAD. We administered 100 mcg of intracoronary nitroglycerin and demonstrated that the high-grade obstruction noted in the left coronary system had essentially completely resolved it was due to coronary spasm from hypothermia and vasoconstriction.   CONTRAST:  Total of 240 cc cc.  COMPLICATIONS:  None   HEMODYNAMICS:  Aortic pressure 107/78 mmHg; LV pressure 108/18; LVEDP 23 mmHg  ANGIOGRAPHIC DATA:   The left main coronary artery is some double ostial without demonstrable left main noted.  The left anterior descending artery is a large distribution vessel giving to large diagonal branches. Pre-nitroglycerin each diagonal and the entire mid and distal LAD was severely constricted and appeared to have greater than 90% obstruction.. After intracoronary nitroglycerin, the mid LAD contained 50% narrowing. The diagonals contain no significant obstruction.  The left circumflex artery is  large and has ostial 60% narrowing prior to intracoronary nitroglycerin and was normal after nitroglycerin.  The right coronary artery is  segmental 99% stenosis in the proximal to mid vessel with TIMI grade 2 flow.Marland Kitchen.   PCI RESULTS: Angioplasty followed by stenting of the proximal to mid resulted in 0% stenosis and TIMI grade 3 flow. A step-up was noted in the distal stent margin. The 38 x 3.0 stent was post dilated to 3.25 mm to  LEFT VENTRICULOGRAM:  Left ventricular angiogram was done in the 30 RAO projection and revealed severe global hypokinesis with inferior akinesis. Estimated EF 15-20%.   IMPRESSIONS:  1. Status post VF arrest with the patient now on the hypothermia protocol after successful resuscitation 2. Transient inferior ST elevation related to high-grade disease in the proximal right coronary as noted above. This was treated with angioplasty followed by bare-metal stent implantation post dilated to 3.25 mm in diameter. 3. There are separate ostia for the circumflex and left anterior descending. Initial obstruction of the left coronary system was significantly improved after intracoronary nitroglycerin, and significant stenoses in the left coronary territory was not felt to be present. 4. Severe left ventricular dysfunction. LVEF is 15-20%   RECOMMENDATION:  We will continue Aggrastat until the hypothermia protocol is completed.  I will load the patient with Brilinta per orogastric tube. There were be overlap with Aggrastat as absorption of Brilinta may be delayed by hypothermia.  Watch for evidence of bleeding.  Bivalirudin has been discontinued at completion of the case.  Patient's overall prognosis seems very guarded.

## 2013-12-21 NOTE — ED Notes (Signed)
i-stat Trop. Result given to Dr. Preston FleetingGlick

## 2013-12-21 NOTE — Procedures (Signed)
Intubation Procedure Note Lara MulchJohn Louthan [id number] 10/11/1955  Procedure: Intubation Indications: ETT needed to be changed  Procedure Details Consent: Unable to obtain consent because of altered level of consciousness. Time Out: Verified patient identification, verified procedure, site/side was marked, verified correct patient position, special equipment/implants available, medications/allergies/relevent history reviewed, required imaging and test results available.  Performed  Drugs versed gtt, fentanyl gtt, cisat gtt DL x 1 with MAC 3 blade Grade 3 view 8-0 ET tube passed through cords under direct visualization over a tube exchanger after removing the 6-0 ETT  Placement confirmed with bilateral breath sounds and smoke in tube   Evaluation Hemodynamic Status: BP stable throughout; O2 sats: stable throughout Patient's Current Condition: stable Complications: No apparent complications Patient did tolerate procedure well. Chest X-ray ordered to verify placement.  CXR: pending.   Max FickleMCQUAID, DOUGLAS 12/21/2013

## 2013-12-21 NOTE — Progress Notes (Signed)
eICU physician Dr. Marin ShutterZubelevitskiy aware patient's core temp is 35.7C, not at target temp of 33c per hypothermia protocol since return from cath lab on previous shift. Aware of attempts to place ice packs in axilla, groin with slow decrease in core temperature to 35.1C. Orders received to administer one liter cold NS, see MAR. Will continue to assess and monitor patient closely.

## 2013-12-21 NOTE — Interval H&P Note (Signed)
History and Physical Interval Note:  12/21/2013 2:06 PM Asked to this patient as an emergency case and possible inferior ischemia. My review of the EKG suggests the possibility that this could be diffuse ischemia or metabolic derangement. His cardiac markers are elevated. He was in VF when found at [facility] [employer] where he works. He received greater than 5 shocks before obtaining stable sinus rhythm. Darryl Mays  has presented today for surgery, with the diagnosis of stemi  The various methods of treatment have been discussed with the patient and family. After consideration of risks, benefits and other options for treatment, the patient has consented to  Procedure(s): LEFT HEART CATHETERIZATION WITH CORONARY ANGIOGRAM (N/A) as a surgical intervention .  The patient's history has been reviewed, patient examined, no change in status, stable for surgery.  I have reviewed the patient's chart and labs.  Questions were answered to the patient's satisfaction.     Lesleigh NoeSMITH III,[name]

## 2013-12-21 NOTE — Procedures (Signed)
Arterial Catheter Insertion Procedure Note Darryl MulchJohn Scinto [id number] 08/02/1955  Procedure: Insertion of Arterial Catheter  Indications: Frequent blood sampling  Procedure Details Consent: Unable to obtain consent because of emergent medical necessity. Time Out: Verified patient identification, verified procedure, site/side was marked, verified correct patient position, special equipment/implants available, medications/allergies/relevent history reviewed, required imaging and test results available.  Performed  Maximum sterile technique was used including antiseptics, cap, gloves, gown, hand hygiene, mask and sheet. Skin prep: Chlorhexidine; local anesthetic administered 20 gauge catheter was inserted into right radial artery using the Seldinger technique.  Evaluation Blood flow good; BP tracing good. Complications: No apparent complications.   Baraa Tubbs, Thelma BargeFrancis 12/21/2013

## 2013-12-21 NOTE — Code Documentation (Signed)
Still unable to obtain bp.

## 2013-12-21 NOTE — ED Notes (Addendum)
Per ems- pt found in bathroom after 20mins- unknown down time. Pt in v-fib upon ems arrival. Pt shocked x5, given 300mg  and 150mg  amiodarone and 5 of epi. Upon arrival to ER pt in v-fib shoked x2 at 360J. Iced saline started by ems.

## 2013-12-22 ENCOUNTER — Inpatient Hospital Stay (HOSPITAL_COMMUNITY): Payer: Medicaid Other

## 2013-12-22 DIAGNOSIS — I2119 ST elevation (STEMI) myocardial infarction involving other coronary artery of inferior wall: Secondary | ICD-10-CM

## 2013-12-22 DIAGNOSIS — J9600 Acute respiratory failure, unspecified whether with hypoxia or hypercapnia: Secondary | ICD-10-CM

## 2013-12-22 DIAGNOSIS — I2510 Atherosclerotic heart disease of native coronary artery without angina pectoris: Secondary | ICD-10-CM

## 2013-12-22 DIAGNOSIS — I4901 Ventricular fibrillation: Principal | ICD-10-CM

## 2013-12-22 LAB — BASIC METABOLIC PANEL
BUN: 13 mg/dL (ref 6–23)
BUN: 13 mg/dL (ref 6–23)
BUN: 14 mg/dL (ref 6–23)
BUN: 14 mg/dL (ref 6–23)
BUN: 15 mg/dL (ref 6–23)
CALCIUM: 6.9 mg/dL — AB (ref 8.4–10.5)
CHLORIDE: 112 meq/L (ref 96–112)
CO2: 17 mEq/L — ABNORMAL LOW (ref 19–32)
CO2: 17 mEq/L — ABNORMAL LOW (ref 19–32)
CO2: 18 meq/L — AB (ref 19–32)
CO2: 18 meq/L — AB (ref 19–32)
CO2: 19 mEq/L (ref 19–32)
Calcium: 6.9 mg/dL — ABNORMAL LOW (ref 8.4–10.5)
Calcium: 7.1 mg/dL — ABNORMAL LOW (ref 8.4–10.5)
Calcium: 6.5 mg/dL — ABNORMAL LOW (ref 8.4–10.5)
Calcium: 6.7 mg/dL — ABNORMAL LOW (ref 8.4–10.5)
Chloride: 113 mEq/L — ABNORMAL HIGH (ref 96–112)
Chloride: 113 mEq/L — ABNORMAL HIGH (ref 96–112)
Chloride: 117 mEq/L — ABNORMAL HIGH (ref 96–112)
Chloride: 116 mEq/L — ABNORMAL HIGH (ref 96–112)
Creatinine, Ser: 0.62 mg/dL (ref 0.50–1.35)
Creatinine, Ser: 0.66 mg/dL (ref 0.50–1.35)
Creatinine, Ser: 0.68 mg/dL (ref 0.50–1.35)
Creatinine, Ser: 0.68 mg/dL (ref 0.50–1.35)
Creatinine, Ser: 0.78 mg/dL (ref 0.50–1.35)
GFR calc Af Amer: >90 mL/min (ref >90)
GFR calc Af Amer: >90 mL/min (ref >90)
GFR calc Af Amer: >90 mL/min (ref >90)
GFR calc Af Amer: >90 mL/min (ref >90)
GFR calc non Af Amer: >90 mL/min (ref >90)
GFR calc non Af Amer: >90 mL/min (ref >90)
GFR calc non Af Amer: >90 mL/min (ref >90)
GFR calc non Af Amer: >90 mL/min (ref >90)
GFR calc non Af Amer: >90 mL/min (ref >90)
GLUCOSE: 111 mg/dL — AB (ref 70–99)
GLUCOSE: 82 mg/dL (ref 70–99)
Glucose, Bld: 131 mg/dL — ABNORMAL HIGH (ref 70–99)
Glucose, Bld: 88 mg/dL (ref 70–99)
Glucose, Bld: 97 mg/dL (ref 70–99)
POTASSIUM: 3.8 meq/L (ref 3.7–5.3)
POTASSIUM: 3.9 meq/L (ref 3.7–5.3)
POTASSIUM: 3.9 meq/L (ref 3.7–5.3)
Potassium: 3.9 mEq/L (ref 3.7–5.3)
Potassium: 4.2 mEq/L (ref 3.7–5.3)
SODIUM: 146 meq/L (ref 137–147)
SODIUM: 150 meq/L — AB (ref 137–147)
SODIUM: 150 meq/L — AB (ref 137–147)
Sodium: 147 mEq/L (ref 137–147)
Sodium: 147 mEq/L (ref 137–147)

## 2013-12-22 LAB — CBC
HCT: 45.5 % (ref 39.0–52.0)
Hemoglobin: 14.9 g/dL (ref 13.0–17.0)
MCH: 33.1 pg (ref 26.0–34.0)
MCHC: 32.7 g/dL (ref 30.0–36.0)
MCV: 101.1 fL — ABNORMAL HIGH (ref 78.0–100.0)
Platelets: 126 10*3/uL — ABNORMAL LOW (ref 150–400)
RBC: 4.5 MIL/uL (ref 4.22–5.81)
RDW: 12.9 % (ref 11.5–15.5)
WBC: 12.9 10*3/uL — ABNORMAL HIGH (ref 4.0–10.5)

## 2013-12-22 LAB — URINE DRUGS OF ABUSE SCREEN W ALC, ROUTINE (REF LAB)
AMPHETAMINE SCRN UR: NEGATIVE
Barbiturate Quant, Ur: NEGATIVE
Benzodiazepines.: NEGATIVE
CREATININE, U: 24.9 mg/dL
Cocaine Metabolites: NEGATIVE
Ethyl Alcohol: <10 mg/dL (ref <10)
MARIJUANA METABOLITE: NEGATIVE
METHADONE: NEGATIVE
Opiate Screen, Urine: NEGATIVE
PROPOXYPHENE: NEGATIVE
Phencyclidine (PCP): NEGATIVE

## 2013-12-22 LAB — GLUCOSE, CAPILLARY
GLUCOSE-CAPILLARY: 92 mg/dL (ref 70–99)
GLUCOSE-CAPILLARY: 101 mg/dL — AB (ref 70–99)
GLUCOSE-CAPILLARY: 75 mg/dL (ref 70–99)
Glucose-Capillary: 126 mg/dL — ABNORMAL HIGH (ref 70–99)
Glucose-Capillary: 53 mg/dL — ABNORMAL LOW (ref 70–99)
Glucose-Capillary: 63 mg/dL — ABNORMAL LOW (ref 70–99)
Glucose-Capillary: 71 mg/dL (ref 70–99)
Glucose-Capillary: 80 mg/dL (ref 70–99)
Glucose-Capillary: 82 mg/dL (ref 70–99)
Glucose-Capillary: 86 mg/dL (ref 70–99)

## 2013-12-22 LAB — BLOOD GAS, ARTERIAL
ACID-BASE DEFICIT: 8.2 mmol/L — AB (ref 0.0–2.0)
Bicarbonate: 17.4 mEq/L — ABNORMAL LOW (ref 20.0–24.0)
Drawn by: 24513
FIO2: 40 %
MECHVT: 500 mL
O2 Saturation: 94.7 %
PEEP: 5 cmH2O
PO2 ART: 63.3 mmHg — AB (ref 80.0–100.0)
Patient temperature: 91.4
RATE: 20 resp/min
TCO2: 18.5 mmol/L (ref 0–100)
pCO2 arterial: 31.7 mmHg — ABNORMAL LOW (ref 35.0–45.0)
pH, Arterial: 7.332 — ABNORMAL LOW (ref 7.350–7.450)

## 2013-12-22 LAB — LACTIC ACID, PLASMA
Lactic Acid, Venous: 2 mmol/L (ref 0.5–2.2)
Lactic Acid, Venous: 1.7 mmol/L (ref 0.5–2.2)
Lactic Acid, Venous: 1.2 mmol/L (ref 0.5–2.2)

## 2013-12-22 MED ORDER — VITAL AF 1.2 CAL PO LIQD
1000.0000 mL | ORAL | Status: DC
  Administered 2013-12-22 – 2013-12-26 (×5): 1000 mL
  Filled 2013-12-22 (×11): qty 1000

## 2013-12-22 MED ORDER — PNEUMOCOCCAL VAC POLYVALENT 25 MCG/0.5ML IJ INJ: 0.5000 mL | INJECTION | INTRAMUSCULAR | Status: DC | PRN

## 2013-12-22 MED ORDER — DEXTROSE 50 % IV SOLN
INTRAVENOUS | Status: AC
  Administered 2013-12-22: 25 mL via INTRAVENOUS
  Filled 2013-12-22: qty 50

## 2013-12-22 MED ORDER — DEXTROSE 50 % IV SOLN
25.0000 mL | Freq: Once | INTRAVENOUS | Status: AC | PRN
  Administered 2013-12-22: 25 mL via INTRAVENOUS

## 2013-12-22 NOTE — Progress Notes (Signed)
PULMONARY / CRITICAL CARE MEDICINE   Name: Darryl Mays MRN: [id number] DOB: 05/04/1956    ADMISSION DATE:  12/21/2013 CONSULTATION DATE:  12/21/2013  REFERRING MD :  EDP PRIMARY SERVICE: PCCM  CHIEF COMPLAINT:  V-Fib Arrest  BRIEF PATIENT DESCRIPTION: 58 y.o. M with unknown PMH, brought to ED after suffering Vfib arrest.  In field received 4 shocks + 3 while in ED.  PCCM was consulted for initiation of hypothermia protocol / admission.  SIGNIFICANT EVENTS / STUDIES:  6/12 - suffered out of hospital vfib arrest. 6/12 Head CT >>> negative.  LINES / TUBES: OETT 6/12 >>> OGT 6/12 >>> Foley 6/12 >>> Left IJ CVL 6/12 >>> A-line  6/12 >>>pulled  6/13   CULTURES: None  ANTIBIOTICS: None  SUBJECTIVE:   On cooling protocol, went to cath lab and had RCA 99% lesion stented, other occlusions reversed with NTG on Left circulation  VITAL SIGNS: Temp:  [89.8 F (32.1 C)-96.6 F (35.9 C)] 92.8 F (33.8 C) (06/13 0800) Pulse Rate:  [45-101] 62 (06/13 0838) Resp:  [0-20] 20 (06/13 0838) BP: (84-135)/(62-95) 104/70 mmHg (06/13 0838) SpO2:  [88 %-100 %] 100 % (06/13 0838) Arterial Line BP: (82-133)/(61-81) 119/75 mmHg (06/12 2245) FiO2 (%):  [40 %-100 %] 40 % (06/13 0838) Weight:  [82.2 kg (181 lb 3.5 oz)] 82.2 kg (181 lb 3.5 oz) (06/13 0453) HEMODYNAMICS: CVP:  [3 mmHg-16 mmHg] 3 mmHg VENTILATOR SETTINGS: Vent Mode:  [-] PRVC FiO2 (%):  [40 %-100 %] 40 % Set Rate:  [20 bmp] 20 bmp Vt Set:  [500 mL] 500 mL PEEP:  [5 cmH20] 5 cmH20 Plateau Pressure:  [17 cmH20-21 cmH20] 18 cmH20 INTAKE / OUTPUT: Intake/Output     06/12 0701 - 06/13 0700 06/13 0701 - 06/14 0700   I.V. (mL/kg) 4944.5 (60.2) 194.7 (2.4)   NG/GT 130    Total Intake(mL/kg) 5074.5 (61.7) 194.7 (2.4)   Urine (mL/kg/hr) 1401 (0.7) 25 (0.2)   Emesis/NG output 600 (0.3)    Total Output 2001 25   Net +3073.5 +169.7          PHYSICAL EXAMINATION: General: Ill appearing male appears older than stated age, Neuro:  paralyzed and sedated HEENT: [place]/AT. PERRL, sclerae anicteric. Cardiovascular: RRR, no M/R/G.  Lungs: Respirations even and unlabored on full vent support.  Abdomen: BS x 4, soft, NT/ND.  Musculoskeletal: No gross deformities, no edema.  Skin: Intact, warm, no rashes.    LABS:  CBC  Recent Labs Lab 12/21/13 0900 12/21/13 1635 12/21/13 2135 12/22/13 0350  WBC 10.4 13.0*  --  12.9*  HGB 14.9 14.6  --  14.9  HCT 43.9 43.6  --  45.5  PLT PLATELET CLUMPS NOTED ON SMEAR, COUNT APPEARS DECREASED 109* 130* 126*   Coag's  Recent Labs Lab 12/21/13 0140 12/21/13 0500 12/21/13 1039  APTT 38* 31 32  INR 1.37 1.21 1.11   BMET  Recent Labs Lab 12/22/13 12/22/13 0350 12/22/13 0800  NA 146 147 147  K 4.2 3.9 3.8  CL 112 113* 113*  CO2 18* 17* 17*  BUN 15 14 14   CREATININE 0.68 0.62 0.66  GLUCOSE 131* 111* 97   Electrolytes  Recent Labs Lab 12/21/13 0500  12/22/13 12/22/13 0350 12/22/13 0800  CALCIUM 7.0*  < > 6.9* 6.9* 7.1*  MG 1.6  --   --   --   --   PHOS 1.7*  --   --   --   --   < > = values in this  interval not displayed. Sepsis Markers  Recent Labs Lab 12/21/13 2010 12/22/13 0205 12/22/13 0757  LATICACIDVEN 2.1 2.0 1.7   ABG  Recent Labs Lab 12/21/13 0500 12/21/13 1431 12/22/13 0600  PHART 7.364 7.210* 7.332*  PCO2ART 41.2 43.8 31.7*  PO2ART 84.0 384.0* 63.3*   Liver Enzymes No results found for this basename: AST, ALT, ALKPHOS, BILITOT, ALBUMIN,  in the last 168 hours Cardiac Enzymes  Recent Labs Lab 12/21/13 0445 12/21/13 0824 12/21/13 1630  TROPONINI 8.87* 16.46* 17.11*   Glucose  Recent Labs Lab 12/21/13 2003 12/21/13 2214 12/21/13 2348 12/22/13 0204 12/22/13 0351 12/22/13 0753  GLUCAP 129* 126* 126* 92 101* 86    Imaging Ct Head Wo Contrast  12/21/2013   CLINICAL DATA:  Cardiac arrest.  EXAM: CT HEAD WITHOUT CONTRAST  TECHNIQUE: Contiguous axial images were obtained from the base of the skull through the vertex without  contrast.  COMPARISON:  None  FINDINGS: Motion degraded exam due to involuntary jerking.  No evidence for acute infarction, hemorrhage, mass lesion, hydrocephalus, or extra-axial fluid. Normal for age cerebral volume. No white matter disease. Calvarium intact. Chronic sinus inflammation. No mastoid fluid. Grossly negative orbits.  IMPRESSION: No acute intracranial abnormality.   Electronically Signed   By: Davonna BellingJohn  Curnes M.D.   On: 12/21/2013 03:00   Dg Chest Port 1 View  12/22/2013   CLINICAL DATA:  Evaluate airspace disease  EXAM: PORTABLE CHEST - 1 VIEW  COMPARISON:  12/21/2013  FINDINGS: ET tube tip is above the carina. There is a left IJ catheter with tip in the projection of the SVC. There is a nasogastric tube with tip in the stomach. Heart size appears normal. Improved appearance of right pulmonary opacity. No new findings.  IMPRESSION: 1. Improved appearance of right lung base opacity. 2. Stable support apparatus.   Electronically Signed   By: Signa Kellaylor  Stroud M.D.   On: 12/22/2013 07:39   Dg Chest Port 1 View  12/21/2013   CLINICAL DATA:  Endotracheal tube placement.  EXAM: PORTABLE CHEST - 1 VIEW  COMPARISON:  12/21/2013  FINDINGS: Endotracheal tube tip measures 5.9 cm above the carinal. Enteric tube tip is off the field of view but below the left hemidiaphragm. Left central venous catheter is unchanged in position. Heart size and pulmonary vascularity are normal for technique. Interval development of linear atelectasis or infiltration in the right lung base. No pneumothorax.  IMPRESSION: Appliances appear in satisfactory location. Developing atelectasis or infiltration in the right lung base.   Electronically Signed   By: Burman NievesWilliam  Stevens M.D.   On: 12/21/2013 04:27   Dg Chest Portable 1 View  12/21/2013   CLINICAL DATA:  Cardiac arrest.  Central line placement.  EXAM: PORTABLE CHEST - 1 VIEW  COMPARISON:  Portable film at 0157 hr.  FINDINGS: Unchanged ET tube and orogastric tube.  Left IJ central  venous line has been inserted and lies with its tip in the mid SVC. There is no pneumothorax. Unchanged mild vascular congestion.  IMPRESSION: Left IJ central venous line satisfactory position.  No pneumothorax.   Electronically Signed   By: Davonna BellingJohn  Curnes M.D.   On: 12/21/2013 02:37   Dg Chest Port 1 View  12/21/2013   CLINICAL DATA:  Post code.  Post intubation.  EXAM: PORTABLE CHEST - 1 VIEW  COMPARISON:  None.  FINDINGS: Cardiac enlargement. Mild vascular congestion. ET tube 7.3 cm above carina. No pneumothorax. No focal areas of consolidation. Orogastric tube tip in the stomach. No definite fusion.  IMPRESSION: Status post intubation. The ET tube tip slightly higher at the level of the clavicles could be advanced 2-3 cm. Mild vascular congestion.   Electronically Signed   By: Davonna Belling M.D.   On: 12/21/2013 02:13     ASSESSMENT / PLAN: Principal Problem:   Cardiac arrest Active Problems:   Ventricular fibrillation   Acute respiratory failure with hypoxia   Encephalopathy acute   ST elevation myocardial infarction (STEMI) of inferior wall   CAD (coronary artery disease), native coronary artery   PULMONARY A: VDRF - in setting of V.Fib arrest. P:   - Full mechanical support, - VAP bundle.   CARDIOVASCULAR A:  S/p V.Fib arrest (total CPR time "brief", shocked 7 times) CAD s/p RCA stent.  Left coronary vasospasm Afib? P:  - per cardiology  RENAL A:   AG metabolic acidosis - lactate clearing  P:   - NS @ 100. - BMP q2hrs.  GASTROINTESTINAL A:   Nutrition P:   - NPO as intubated. - Nutrition consult for TF's. - SUP: pantoprazole.  HEMATOLOGIC A:  No acute issues. P:  - VTE Proph:  Heparin / SCD's. - CBC in AM.  INFECTIOUS A:   No acute issues. P:   - no ABX - PCT Algorithm.  ENDOCRINE A:   Hyperglycemia - no known hx of DM.  Cortisol OK P:   - ICU hyperglycemia protocol.   NEUROLOGIC A:   Acute encephalopathy P:   - Sedation:  Versed /  Fentanyl. - Nimbex. - Goal RASS -2. - Hold daily WUA.  Need to find family>>social svc consult   I have personally obtained a history, examined the patient, evaluated laboratory and imaging results, formulated the assessment and plan and placed orders. CRITICAL CARE: The patient is critically ill with multiple organ systems failure and requires high complexity decision making for assessment and support, frequent evaluation and titration of therapies, application of advanced monitoring technologies and extensive interpretation of multiple databases. Critical Care Time devoted to patient care services described in this note is 30 minutes.   Dorcas Carrow Beeper  [phone]  Cell  [phone]  If no response or cell goes to voicemail, call beeper [phone]   12/22/2013, 8:47 AM

## 2013-12-22 NOTE — Progress Notes (Signed)
Patient Name: Darryl MulchJohn Adler      SUBJECTIVE:  Admitted with VF arrest in context of +Tn but non diagnostic ECG.  Rx W hyptothermia However at about 12 hrs, RN noted ST elevation on monitor>>12 lead confirmed with inferior STE.  CAth lab>>RCA 99%  Rx BMS.  EF 15-20%  Some diffuse spasm and residual 50% LAD   History reviewed. No pertinent past medical history.  Scheduled Meds:  Scheduled Meds: . antiseptic oral rinse  15 mL Mouth Rinse QID  . artificial tears  1 application Both Eyes 3 times per day  . aspirin  81 mg Per NG tube Daily  . chlorhexidine  15 mL Mouth Rinse BID  . heparin  5,000 Units Subcutaneous 3 times per day  . insulin aspart  2-6 Units Subcutaneous 6 times per day  . pantoprazole (PROTONIX) IV  40 mg Intravenous QHS  . ticagrelor  90 mg Per NG tube BID   Continuous Infusions: . sodium chloride 100 mL/hr at 12/22/13 0030  . sodium chloride 250 mL (12/21/13 2300)  . sodium chloride 10 mL/hr at 12/22/13 0157  . sodium chloride 10 mL/hr at 12/21/13 2300  . cisatracurium (NIMBEX) infusion 1.5 mcg/kg/min (12/22/13 0610)  . fentaNYL infusion INTRAVENOUS 200 mcg/hr (12/21/13 2233)  . midazolam (VERSED) infusion 5 mg/hr (12/22/13 0029)  . norepinephrine (LEVOPHED) Adult infusion 5 mcg/min (12/22/13 0800)  . tirofiban 0.15 mcg/kg/min (12/22/13 0255)    PHYSICAL EXAM Filed Vitals:   12/22/13 0645 12/22/13 0700 12/22/13 0800 12/22/13 0838  BP: 102/80 100/73 95/73 104/70  Pulse:  59 57 62  Temp: 92.7 F (33.7 C) 92.8 F (33.8 C) 92.8 F (33.8 C)   TempSrc: Core (Comment) Core (Comment) Core (Comment)   Resp: 20 20 20 20   Height:      Weight:      SpO2: 100% 100% 100% 100%    General appearance: appears stated age and sedated Lungs: clear to auscultation bilaterally Heart: regular rate and rhythm, S1, S2 normal, no murmur, click, rub or gallop Abdomen: soft, non-tender; bowel sounds normal; no masses,  no organomegaly Extremities: edema none Skin:  Skin color, texture, turgor normal. No rashes or lesions Neurologic: sedated nad non responsive TELEMETRY: Reviewed telemetry pt in nsr   Intake/Output Summary (Last 24 hours) at 12/22/13 0853 Last data filed at 12/22/13 0800  Gross per 24 hour  Intake 5095.57 ml  Output   2026 ml  Net 3069.57 ml    LABS: Basic Metabolic Panel:  Recent Labs Lab 12/21/13 0430 12/21/13 0500  12/21/13 1039 12/21/13 1232 12/21/13 1635 12/21/13 2000 12/22/13 12/22/13 0350 12/22/13 0800  NA 146 148*  < > 149* 145 145 147 146 147 147  K 4.0 3.1*  < > 4.1 4.2 4.1 4.3 4.2 3.9 3.8  CL 107 114*  < > 115* 113* 112 112 112 113* 113*  CO2 22 21  < > 20 18* 19 19 18* 17* 17*  GLUCOSE 241* 114*  < > 116* 353* 192* 141* 131* 111* 97  BUN 12 14  < > 17 18 18 17 15 14 14   CREATININE 0.83 0.63  < > 0.68 0.65 0.74 0.74 0.68 0.62 0.66  CALCIUM 7.5* 7.0*  < > 7.5* 7.2* 7.0* 7.1* 6.9* 6.9* 7.1*  MG  --  1.6  --   --   --   --   --   --   --   --   PHOS  --  1.7*  --   --   --   --   --   --   --   --   < > = values in this interval not displayed. Cardiac Enzymes:  Recent Labs  12/21/13 0445 12/21/13 0824 12/21/13 1630  TROPONINI 8.87* 16.46* 17.11*   CBC:  Recent Labs Lab 12/21/13 0140 12/21/13 0430 12/21/13 0900 12/21/13 1635 12/21/13 2135 12/22/13 0350  WBC 8.2 9.0 10.4 13.0*  --  12.9*  NEUTROABS 3.0  --   --   --   --   --   HGB 12.9* 14.6 14.9 14.6  --  14.9  HCT 41.0 43.2 43.9 43.6  --  45.5  MCV 104.3* 101.2* 99.3 100.0  --  101.1*  PLT 92* 149* PLATELET CLUMPS NOTED ON SMEAR, COUNT APPEARS DECREASED 109* 130* 126*   PROTIME:  Recent Labs  12/21/13 0140 12/21/13 0500 12/21/13 1039  LABPROT 16.5* 15.0 14.1  INR 1.37 1.21 1.11     ASSESSMENT AND PLAN:  Principal Problem:   Cardiac arrest Active Problems:   Ventricular fibrillation   Acute respiratory failure with hypoxia   Encephalopathy acute   ST elevation myocardial infarction (STEMI) of inferior wall   CAD  (coronary artery disease), native coronary artery  On pressor support Rewarming now Continue supportive care adn aggrostat  Signed, Sherryl MangesSteven Isidor Bromell MD  12/22/2013

## 2013-12-22 NOTE — Progress Notes (Signed)
ANTICOAGULATION CONSULT NOTE - Initial Consult  Pharmacy Consult for aggrastat Indication: chest pain/ACS/code stemi  No Known Allergies  Patient Measurements: Height: 6\' 1"  (185.4 cm) Weight: 181 lb 3.5 oz (82.2 kg) IBW/kg (Calculated) : 79.9  Vital Signs: Temp: 92.8 F (33.8 C) (06/13 0700) Temp src: Core (Comment) (06/13 0700) BP: 100/73 mmHg (06/13 0700) Pulse Rate: 59 (06/13 0700)  Labs:  Recent Labs  12/21/13 0140  12/21/13 0445 12/21/13 0500  12/21/13 0824 12/21/13 0900 12/21/13 1039  12/21/13 1630 12/21/13 1635 12/21/13 2000 12/21/13 2135 12/22/13 12/22/13 0350  HGB 12.9*  < >  --   --   --   --  14.9  --   --   --  14.6  --   --   --  14.9  HCT 41.0  < >  --   --   --   --  43.9  --   --   --  43.6  --   --   --  45.5  PLT 92*  < >  --   --   --   --  PLATELET CLUMPS NOTED ON SMEAR, COUNT APPEARS DECREASED  --   --   --  109*  --  130*  --  126*  APTT 38*  --   --  31  --   --   --  32  --   --   --   --   --   --   --   LABPROT 16.5*  --   --  15.0  --   --   --  14.1  --   --   --   --   --   --   --   INR 1.37  --   --  1.21  --   --   --  1.11  --   --   --   --   --   --   --   CREATININE 1.02  < >  --  0.63  < >  --   --  0.68  < >  --  0.74 0.74  --  0.68 0.62  TROPONINI  --   --  8.87*  --   --  16.46*  --   --   --  17.11*  --   --   --   --   --   < > = values in this interval not displayed.  Estimated Creatinine Clearance: 116.5 ml/min (by C-G formula based on Cr of 0.62).   Medical History: History reviewed. No pertinent past medical history.   Assessment: 58 year old vfib arrest on hypothermia protocol, currently rewarming. Noted EKG changes with elevated CE's now s/p BMS to RCA. Aggrastat ordered to be continued until patient is warmed. Brilinta has also been started post cath. Renal function normal. Platelet count has fallen slightly to 126, no signs of bleeding, however. Aggrastat to be discontinued when patient is rewarmed.   Goal of  Therapy:   Monitor platelets by anticoagulation protocol: Yes   Plan:  Aggrastat 0.4415mcg/kg/min until rewarmed Daily CBC   Myleen Brailsford B. Artelia Larocheung, PharmD Clinical Pharmacist - Resident Phone: [phone] Pager: [phone] 12/22/2013 7:44 AM

## 2013-12-23 LAB — BASIC METABOLIC PANEL
BUN: 12 mg/dL (ref 6–23)
CHLORIDE: 113 meq/L — AB (ref 96–112)
CO2: 21 mEq/L (ref 19–32)
Calcium: 7.5 mg/dL — ABNORMAL LOW (ref 8.4–10.5)
Creatinine, Ser: 0.83 mg/dL (ref 0.50–1.35)
GFR calc Af Amer: >90 mL/min (ref >90)
Glucose, Bld: 119 mg/dL — ABNORMAL HIGH (ref 70–99)
POTASSIUM: 3.6 meq/L — AB (ref 3.7–5.3)
Sodium: 149 mEq/L — ABNORMAL HIGH (ref 137–147)

## 2013-12-23 LAB — CBC
HCT: 40.8 % (ref 39.0–52.0)
HEMOGLOBIN: 13.3 g/dL (ref 13.0–17.0)
MCH: 33.5 pg (ref 26.0–34.0)
MCHC: 32.6 g/dL (ref 30.0–36.0)
MCV: 102.8 fL — AB (ref 78.0–100.0)
Platelets: 125 10*3/uL — ABNORMAL LOW (ref 150–400)
RBC: 3.97 MIL/uL — AB (ref 4.22–5.81)
RDW: 13.1 % (ref 11.5–15.5)
WBC: 10.8 10*3/uL — ABNORMAL HIGH (ref 4.0–10.5)

## 2013-12-23 LAB — GLUCOSE, CAPILLARY
GLUCOSE-CAPILLARY: 108 mg/dL — AB (ref 70–99)
GLUCOSE-CAPILLARY: 79 mg/dL (ref 70–99)
Glucose-Capillary: 92 mg/dL (ref 70–99)
Glucose-Capillary: 122 mg/dL — ABNORMAL HIGH (ref 70–99)
Glucose-Capillary: 144 mg/dL — ABNORMAL HIGH (ref 70–99)

## 2013-12-23 MED ORDER — POTASSIUM CHLORIDE 20 MEQ/15ML (10%) PO LIQD
20.0000 meq | ORAL | Status: AC
  Administered 2013-12-23 (×2): 20 meq
  Filled 2013-12-23 (×2): qty 15

## 2013-12-23 MED ORDER — METOPROLOL TARTRATE 1 MG/ML IV SOLN
5.0000 mg | Freq: Four times a day (QID) | INTRAVENOUS | Status: DC
  Administered 2013-12-23 – 2013-12-24 (×4): 5 mg via INTRAVENOUS
  Filled 2013-12-23 (×8): qty 5

## 2013-12-23 MED ORDER — FENTANYL CITRATE 0.05 MG/ML IJ SOLN
50.0000 ug | INTRAMUSCULAR | Status: DC | PRN
  Administered 2013-12-24 (×3): 50 ug via INTRAVENOUS
  Filled 2013-12-23 (×4): qty 2

## 2013-12-23 MED ORDER — ACETAMINOPHEN 160 MG/5ML PO SOLN
650.0000 mg | Freq: Four times a day (QID) | ORAL | Status: DC | PRN
  Administered 2013-12-23 – 2013-12-24 (×3): 650 mg
  Filled 2013-12-23 (×3): qty 20.3

## 2013-12-23 NOTE — Progress Notes (Signed)
[facility] ADULT [facility] REPLACEMENT PROTOCOL FOR AM LAB REPLACEMENT ONLY  The patient does apply for the [facility] Electrolyte Replacment Protocol based on the criteria listed below:   1. Is GFR >/= 40 ml/min? yes  Patient's GFR today is >90 2. Is urine output >/= 0.5 ml/kg/hr for the last 6 hours? yes Patient's UOP is 0.5 ml/kg/hr 3. Is BUN < 60 mg/dL? yes  Patient's BUN today is 12 4. Abnormal electrolyte(s): K+3.6 5. Ordered repletion with: protocol 6. If a panic level lab has been reported, has the CCM MD in charge been notified? yes.   Physician:  Jonni SangerP Wright  Cathlean CowerBradshaw, Gitty Osterlund [facility] 12/23/2013 5:57 AM

## 2013-12-23 NOTE — Progress Notes (Signed)
Patient Name: Darryl Mays      SUBJECTIVE:  Admitted with VF arrest in context of +Tn but non diagnostic ECG.  Rx W hyptothermia However at about 12 hrs, RN noted ST elevation on monitor>>12 lead confirmed with inferior STE.  CAth lab>>RCA 99%  Rx BMS.  EF 15-20%  Some diffuse spasm and residual 50% LAD  Rewarmed without sedation  Intubated    History reviewed. No pertinent past medical history.-NOT available  Scheduled Meds:  Scheduled Meds: . antiseptic oral rinse  15 mL Mouth Rinse QID  . artificial tears  1 application Both Eyes 3 times per day  . aspirin  81 mg Per NG tube Daily  . chlorhexidine  15 mL Mouth Rinse BID  . heparin  5,000 Units Subcutaneous 3 times per day  . insulin aspart  2-6 Units Subcutaneous 6 times per day  . pantoprazole (PROTONIX) IV  40 mg Intravenous QHS  . potassium chloride  20 mEq Per Tube Q4H  . ticagrelor  90 mg Per NG tube BID   Continuous Infusions: . sodium chloride Stopped (12/22/13 1900)  . sodium chloride 250 mL (12/23/13 0800)  . sodium chloride Stopped (12/22/13 1900)  . sodium chloride Stopped (12/22/13 1900)  . feeding supplement (VITAL AF 1.2 CAL) 1,000 mL (12/23/13 0800)  . fentaNYL infusion INTRAVENOUS Stopped (12/22/13 1900)  . norepinephrine (LEVOPHED) Adult infusion Stopped (12/22/13 1900)    PHYSICAL EXAM Filed Vitals:   12/23/13 0500 12/23/13 0600 12/23/13 0700 12/23/13 0800  BP: 131/68 141/70 137/74 142/73  Pulse: 99 101 98 104  Temp:    100.5 F (38.1 C)  TempSrc:    Oral  Resp: 21 22 24 26   Height:      Weight:      SpO2: 100% 100% 100% 100%    General appearance: appears stated age and sedated Lungs: clear to auscultation bilaterally Heart: regular rate and rhythm, S1, S2 normal, no murmur, click, rub or gallop Abdomen: soft, non-tender; bowel sounds normal; no masses,  no organomegaly Extremities: edema none Skin: Skin color, texture, turgor normal. No rashes or lesions Neurologic: non  responsive  Eyes rolled back in head  TELEMETRY: Reviewed telemetry pt in sinus    Intake/Output Summary (Last 24 hours) at 12/23/13 0820 Last data filed at 12/23/13 0800  Gross per 24 hour  Intake 2177.73 ml  Output   1515 ml  Net 662.73 ml    LABS: Basic Metabolic Panel:  Recent Labs Lab 12/21/13 0430 12/21/13 0500  12/21/13 2000 12/22/13 12/22/13 0350 12/22/13 0800 12/22/13 1155 12/22/13 1600 12/23/13 0431  NA 146 148*  < > 147 146 147 147 150* 150* 149*  K 4.0 3.1*  < > 4.3 4.2 3.9 3.8 3.9 3.9 3.6*  CL 107 114*  < > 112 112 113* 113* 117* 116* 113*  CO2 22 21  < > 19 18* 17* 17* 18* 19 21  GLUCOSE 241* 114*  < > 141* 131* 111* 97 88 82 119*  BUN 12 14  < > 17 15 14 14 13 13 12   CREATININE 0.83 0.63  < > 0.74 0.68 0.62 0.66 0.68 0.78 0.83  CALCIUM 7.5* 7.0*  < > 7.1* 6.9* 6.9* 7.1* 6.5* 6.7* 7.5*  MG  --  1.6  --   --   --   --   --   --   --   --   PHOS  --  1.7*  --   --   --   --   --   --   --   --   < > =  values in this interval not displayed. Cardiac Enzymes:  Recent Labs  12/21/13 0445 12/21/13 0824 12/21/13 1630  TROPONINI 8.87* 16.46* 17.11*   CBC:  Recent Labs Lab 12/21/13 0140 12/21/13 0430 12/21/13 0900 12/21/13 1635 12/21/13 2135 12/22/13 0350 12/23/13 0430  WBC 8.2 9.0 10.4 13.0*  --  12.9* 10.8*  NEUTROABS 3.0  --   --   --   --   --   --   HGB 12.9* 14.6 14.9 14.6  --  14.9 13.3  HCT 41.0 43.2 43.9 43.6  --  45.5 40.8  MCV 104.3* 101.2* 99.3 100.0  --  101.1* 102.8*  PLT 92* 149* PLATELET CLUMPS NOTED ON SMEAR, COUNT APPEARS DECREASED 109* 130* 126* 125*   PROTIME:  Recent Labs  12/21/13 0140 12/21/13 0500 12/21/13 1039  LABPROT 16.5* 15.0 14.1  INR 1.37 1.21 1.11     ASSESSMENT AND PLAN:  Principal Problem:   Cardiac arrest Active Problems:   Ventricular fibrillation   Acute respiratory failure with hypoxia   Encephalopathy acute   ST elevation myocardial infarction (STEMI) of inferior wall   CAD (coronary artery  disease), native coronary artery   continue supportive care  Neurological status is concerning Hemodynamics are stable  Signed, Sherryl MangesSteven Shiheem Corporan MD  12/23/2013

## 2013-12-23 NOTE — Progress Notes (Addendum)
Versed and fentanyl infusions discontinued per order, see MAR. Versed 25mg /5325mL wasted and fentanyl 75mL wasted from infusion bags.

## 2013-12-23 NOTE — Progress Notes (Signed)
PULMONARY / CRITICAL CARE MEDICINE   Name: Darryl MulchJohn Fogarty MRN: [id number] DOB: 10/28/1955    ADMISSION DATE:  12/21/2013 CONSULTATION DATE:  12/21/2013  REFERRING MD :  EDP PRIMARY SERVICE: PCCM  CHIEF COMPLAINT:  V-Fib Arrest  BRIEF PATIENT DESCRIPTION: 58 y.o. M with unknown PMH, brought to ED after suffering Vfib arrest.  In field received 4 shocks + 3 while in ED.  PCCM was consulted for initiation of hypothermia protocol / admission.  SIGNIFICANT EVENTS / STUDIES:  6/12 - suffered out of hospital vfib arrest. 6/12 Head CT >>> negative.  LINES / TUBES: OETT 6/12 >>> OGT 6/12 >>> Foley 6/12 >>> Left IJ CVL 6/12 >>> A-line  6/12 >>>pulled  6/13   CULTURES: None  ANTIBIOTICS: None  SUBJECTIVE:   Rewarmed   VITAL SIGNS: Temp:  [96.8 F (36 C)-100.6 F (38.1 C)] 100.6 F (38.1 C) (06/14 1200) Pulse Rate:  [72-121] 108 (06/14 1300) Resp:  [13-35] 32 (06/14 1300) BP: (112-164)/(64-106) 142/77 mmHg (06/14 1300) SpO2:  [99 %-100 %] 100 % (06/14 1300) FiO2 (%):  [40 %] 40 % (06/14 1240) Weight:  [81.4 kg (179 lb 7.3 oz)] 81.4 kg (179 lb 7.3 oz) (06/14 0431) HEMODYNAMICS: CVP:  [6 mmHg-12 mmHg] 9 mmHg VENTILATOR SETTINGS: Vent Mode:  [-] PRVC FiO2 (%):  [40 %] 40 % Set Rate:  [20 bmp] 20 bmp Vt Set:  [500 mL] 500 mL PEEP:  [5 cmH20] 5 cmH20 Plateau Pressure:  [14 cmH20-22 cmH20] 15 cmH20 INTAKE / OUTPUT: Intake/Output     06/13 0701 - 06/14 0700 06/14 0701 - 06/15 0700   I.V. (mL/kg) 1881.6 (23.1) 60 (0.7)   NG/GT 445.8 310   Total Intake(mL/kg) 2327.4 (28.6) 370 (4.5)   Urine (mL/kg/hr) 1105 (0.6) 375 (0.7)   Emesis/NG output 310 (0.2)    Total Output 1415 375   Net +912.4 -5          PHYSICAL EXAMINATION: General: Ill appearing male appears older than stated age Neuro: perrl 2 mm, not following commands, slight cough HEENT: [place]/AT. PERRL, sclerae anicteric. Cardiovascular: RRR, no M/R/G.  Lungs: ronchi Abdomen: BS x 4, soft, NT/ND.  Musculoskeletal: No  gross deformities, no edema.  Skin: Intact, warm, no rashes.    LABS:  CBC  Recent Labs Lab 12/21/13 1635 12/21/13 2135 12/22/13 0350 12/23/13 0430  WBC 13.0*  --  12.9* 10.8*  HGB 14.6  --  14.9 13.3  HCT 43.6  --  45.5 40.8  PLT 109* 130* 126* 125*   Coag's  Recent Labs Lab 12/21/13 0140 12/21/13 0500 12/21/13 1039  APTT 38* 31 32  INR 1.37 1.21 1.11   BMET  Recent Labs Lab 12/22/13 1155 12/22/13 1600 12/23/13 0431  NA 150* 150* 149*  K 3.9 3.9 3.6*  CL 117* 116* 113*  CO2 18* 19 21  BUN 13 13 12   CREATININE 0.68 0.78 0.83  GLUCOSE 88 82 119*   Electrolytes  Recent Labs Lab 12/21/13 0500  12/22/13 1155 12/22/13 1600 12/23/13 0431  CALCIUM 7.0*  < > 6.5* 6.7* 7.5*  MG 1.6  --   --   --   --   PHOS 1.7*  --   --   --   --   < > = values in this interval not displayed. Sepsis Markers  Recent Labs Lab 12/22/13 0205 12/22/13 0757 12/22/13 1433  LATICACIDVEN 2.0 1.7 1.2   ABG  Recent Labs Lab 12/21/13 0500 12/21/13 1431 12/22/13 0600  PHART 7.364 7.210*  7.332*  PCO2ART 41.2 43.8 31.7*  PO2ART 84.0 384.0* 63.3*   Liver Enzymes No results found for this basename: AST, ALT, ALKPHOS, BILITOT, ALBUMIN,  in the last 168 hours Cardiac Enzymes  Recent Labs Lab 12/21/13 0445 12/21/13 0824 12/21/13 1630  TROPONINI 8.87* 16.46* 17.11*   Glucose  Recent Labs Lab 12/22/13 2023 12/22/13 2050 12/22/13 2354 12/23/13 0330 12/23/13 0757 12/23/13 1112  GLUCAP 82 80 79 92 108* 122*    Imaging Dg Chest Port 1 View  12/22/2013   CLINICAL DATA:  Evaluate airspace disease  EXAM: PORTABLE CHEST - 1 VIEW  COMPARISON:  12/21/2013  FINDINGS: ET tube tip is above the carina. There is a left IJ catheter with tip in the projection of the SVC. There is a nasogastric tube with tip in the stomach. Heart size appears normal. Improved appearance of right pulmonary opacity. No new findings.  IMPRESSION: 1. Improved appearance of right lung base opacity.  2. Stable support apparatus.   Electronically Signed   By: Signa Kellaylor  Stroud M.D.   On: 12/22/2013 07:39     ASSESSMENT / PLAN: Principal Problem:   Cardiac arrest Active Problems:   Ventricular fibrillation   Acute respiratory failure with hypoxia   Encephalopathy acute   ST elevation myocardial infarction (STEMI) of inferior wall   CAD (coronary artery disease), native coronary artery   PULMONARY A: VDRF - in setting of V.Fib arrest. P:   -last abg reviewed, keep same MV -consider SBT attempt, cpap5 ps 5-10, no extubation planed -consider even balance goals -pcxr in am -abg in am   CARDIOVASCULAR A:  S/p V.Fib arrest (total CPR time "brief", shocked 7 times) CAD s/p RCA stent.  Left coronary vasospasm Afib? P:  - per cardiology -asa, brilenta -MAp goal off hypothermia to 60 -consider BB in future -avoid tachy as able  RENAL A:   AG metabolic acidosis - lactate clearing  P:   - NS @ 100, to kvo - BMP in am  -allow na rise to 155   GASTROINTESTINAL A:   Nutrition P - Nutrition consult for TF's, started, to goal pm aftrernoon - SUP: pantoprazole.  HEMATOLOGIC A:  No acute issues. P:  - VTE Proph:  Heparin / SCD's. - CBC in AM.  INFECTIOUS A:  S/o hypothermia, at risk infection P:   - follow fever curve  ENDOCRINE A:   Hyperglycemia - no known hx of DM.  Cortisol OK P:   - ICU hyperglycemia protocol.  NEUROLOGIC A:   Acute encephalopathy P:   - Sedation:  Versed / Fentanyl - to off, wua - Goal RASS 0 - Hold daily WUA -eeg  I have personally obtained a history, examined the patient, evaluated laboratory and imaging results, formulated the assessment and plan and placed orders. CRITICAL CARE: The patient is critically ill with multiple organ systems failure and requires high complexity decision making for assessment and support, frequent evaluation and titration of therapies, application of advanced monitoring technologies and extensive  interpretation of multiple databases. Critical Care Time devoted to patient care services described in this note is 30 minutes.   [facility] Center LtdFEINSTEIN,Therisa Mennella J.MD Beeper  [phone]  Cell  [phone]  If no response or cell goes to voicemail, call beeper [phone]   12/23/2013, 1:10 PM

## 2013-12-24 ENCOUNTER — Inpatient Hospital Stay (HOSPITAL_COMMUNITY): Payer: Medicaid Other

## 2013-12-24 DIAGNOSIS — G9340 Encephalopathy, unspecified: Secondary | ICD-10-CM

## 2013-12-24 DIAGNOSIS — Z66 Do not resuscitate: Secondary | ICD-10-CM | POA: Diagnosis not present

## 2013-12-24 LAB — GLUCOSE, CAPILLARY
GLUCOSE-CAPILLARY: 117 mg/dL — AB (ref 70–99)
GLUCOSE-CAPILLARY: 122 mg/dL — AB (ref 70–99)
GLUCOSE-CAPILLARY: 123 mg/dL — AB (ref 70–99)
GLUCOSE-CAPILLARY: 125 mg/dL — AB (ref 70–99)
Glucose-Capillary: 116 mg/dL — ABNORMAL HIGH (ref 70–99)
Glucose-Capillary: 123 mg/dL — ABNORMAL HIGH (ref 70–99)
Glucose-Capillary: 130 mg/dL — ABNORMAL HIGH (ref 70–99)
Glucose-Capillary: 117 mg/dL — ABNORMAL HIGH (ref 70–99)

## 2013-12-24 LAB — BLOOD GAS, ARTERIAL
ACID-BASE EXCESS: 0.7 mmol/L (ref 0.0–2.0)
Bicarbonate: 23.5 mEq/L (ref 20.0–24.0)
DRAWN BY: 24513
FIO2: 0.4 %
LHR: 20 {breaths}/min
O2 Saturation: 98.7 %
PEEP: 5 cmH2O
Patient temperature: 98.6
TCO2: 24.4 mmol/L (ref 0–100)
VT: 500 mL
pCO2 arterial: 29.7 mmHg — ABNORMAL LOW (ref 35.0–45.0)
pH, Arterial: 7.51 — ABNORMAL HIGH (ref 7.350–7.450)
pO2, Arterial: 74.9 mmHg — ABNORMAL LOW (ref 80.0–100.0)

## 2013-12-24 LAB — CBC WITH DIFFERENTIAL/PLATELET
Basophils Absolute: 0 10*3/uL (ref 0.0–0.1)
Basophils Relative: 0 % (ref 0–1)
Eosinophils Absolute: 0 10*3/uL (ref 0.0–0.7)
Eosinophils Relative: 0 % (ref 0–5)
HCT: 35.3 % — ABNORMAL LOW (ref 39.0–52.0)
HEMOGLOBIN: 11.5 g/dL — AB (ref 13.0–17.0)
LYMPHS ABS: 0.6 10*3/uL — AB (ref 0.7–4.0)
LYMPHS PCT: 7 % — AB (ref 12–46)
MCH: 33 pg (ref 26.0–34.0)
MCHC: 32.6 g/dL (ref 30.0–36.0)
MCV: 101.4 fL — ABNORMAL HIGH (ref 78.0–100.0)
MONO ABS: 1 10*3/uL (ref 0.1–1.0)
Monocytes Relative: 12 % (ref 3–12)
Neutro Abs: 7.2 10*3/uL (ref 1.7–7.7)
Neutrophils Relative %: 81 % — ABNORMAL HIGH (ref 43–77)
PLATELETS: DECREASED 10*3/uL (ref 150–400)
RBC: 3.48 MIL/uL — ABNORMAL LOW (ref 4.22–5.81)
RDW: 13.1 % (ref 11.5–15.5)
WBC: 8.9 10*3/uL (ref 4.0–10.5)

## 2013-12-24 LAB — COMPREHENSIVE METABOLIC PANEL
ALT: 78 U/L — ABNORMAL HIGH (ref 0–53)
AST: 106 U/L — ABNORMAL HIGH (ref 0–37)
Albumin: 2.4 g/dL — ABNORMAL LOW (ref 3.5–5.2)
Alkaline Phosphatase: 35 U/L — ABNORMAL LOW (ref 39–117)
BUN: 19 mg/dL (ref 6–23)
CO2: 24 mEq/L (ref 19–32)
Calcium: 7.6 mg/dL — ABNORMAL LOW (ref 8.4–10.5)
Chloride: 115 mEq/L — ABNORMAL HIGH (ref 96–112)
Creatinine, Ser: 0.83 mg/dL (ref 0.50–1.35)
GFR calc Af Amer: >90 mL/min (ref >90)
GFR calc non Af Amer: >90 mL/min (ref >90)
Glucose, Bld: 123 mg/dL — ABNORMAL HIGH (ref 70–99)
Potassium: 2.9 mEq/L — CL (ref 3.7–5.3)
Sodium: 150 mEq/L — ABNORMAL HIGH (ref 137–147)
TOTAL PROTEIN: 5 g/dL — AB (ref 6.0–8.3)
Total Bilirubin: 0.6 mg/dL (ref 0.3–1.2)

## 2013-12-24 LAB — PROCALCITONIN: Procalcitonin: 0.71 ng/mL

## 2013-12-24 MED ORDER — POTASSIUM CHLORIDE 20 MEQ/15ML (10%) PO LIQD
40.0000 meq | Freq: Once | ORAL | Status: AC
  Administered 2013-12-24: 40 meq
  Filled 2013-12-24: qty 30

## 2013-12-24 MED ORDER — PIPERACILLIN-TAZOBACTAM 3.375 G IVPB
3.3750 g | Freq: Three times a day (TID) | INTRAVENOUS | Status: DC
  Administered 2013-12-24 – 2013-12-27 (×9): 3.375 g via INTRAVENOUS
  Filled 2013-12-24 (×12): qty 50

## 2013-12-24 MED ORDER — METOPROLOL TARTRATE 25 MG/10 ML ORAL SUSPENSION
25.0000 mg | Freq: Three times a day (TID) | ORAL | Status: DC
  Administered 2013-12-24 – 2013-12-27 (×9): 25 mg
  Filled 2013-12-24 (×12): qty 10

## 2013-12-24 MED ORDER — VANCOMYCIN HCL IN DEXTROSE 1-5 GM/200ML-% IV SOLN
1000.0000 mg | Freq: Three times a day (TID) | INTRAVENOUS | Status: DC
  Administered 2013-12-24 – 2013-12-26 (×6): 1000 mg via INTRAVENOUS
  Filled 2013-12-24 (×9): qty 200

## 2013-12-24 MED ORDER — LORAZEPAM 2 MG/ML IJ SOLN: 2.0000 mg | Freq: Once | INTRAMUSCULAR | Status: AC

## 2013-12-24 MED ORDER — SODIUM CHLORIDE 0.9 % IV SOLN
1000.0000 mg | Freq: Two times a day (BID) | INTRAVENOUS | Status: DC
  Administered 2013-12-24 – 2013-12-27 (×7): 1000 mg via INTRAVENOUS
  Filled 2013-12-24 (×8): qty 10

## 2013-12-24 MED ORDER — POTASSIUM CHLORIDE 10 MEQ/50ML IV SOLN
10.0000 meq | INTRAVENOUS | Status: AC
  Administered 2013-12-24 (×4): 10 meq via INTRAVENOUS
  Filled 2013-12-24 (×4): qty 50

## 2013-12-24 MED ORDER — LORAZEPAM 2 MG/ML IJ SOLN
INTRAMUSCULAR | Status: AC
  Administered 2013-12-24: 2 mg
  Filled 2013-12-24: qty 1

## 2013-12-24 MED ORDER — PANTOPRAZOLE SODIUM 40 MG PO PACK
40.0000 mg | PACK | Freq: Every day | ORAL | Status: DC
  Administered 2013-12-24 – 2013-12-26 (×3): 40 mg
  Filled 2013-12-24 (×6): qty 20

## 2013-12-24 MED ORDER — FREE WATER
200.0000 mL | Freq: Three times a day (TID) | Status: DC
  Administered 2013-12-24 – 2013-12-27 (×9): 200 mL

## 2013-12-24 MED FILL — Sodium Chloride IV Soln 0.9%: INTRAVENOUS | Qty: 50 | Status: AC

## 2013-12-24 NOTE — Consult Note (Signed)
Neurology Consultation Reason for Consult: Altered mental status Referring Physician: Sung AmabileSimonds, D  CC: Altered mental status  History is obtained from: Medical record as patient is altered  HPI: Darryl Mays is a 58 y.o. male with no known past medical history who presented on 6/12 following vfib arrest. He underwent cooilng protocol. Since  rewarming, he has not regained good mental status. He had an EEG performed today which showed generalized periodic discharges and therefore neurology was consulted.  ROS: Unable to obtain due to AMS  History reviewed. No pertinent past medical history.  Family History: Unknown, AMS  Social History: Tob: unknown  Exam: Current vital signs: BP 139/70  Pulse 92  Temp(Src) 100.9 F (38.3 C) (Oral)  Resp 27  Ht 6\' 1"  (1.854 m)  Wt 82.2 kg (181 lb 3.5 oz)  BMI 23.91 kg/m2  SpO2 100% Vital signs in last 24 hours: Temp:  [100.4 F (38 C)-100.9 F (38.3 C)] 100.9 F (38.3 C) (06/15 0400) Pulse Rate:  [58-114] 92 (06/15 0900) Resp:  [25-35] 27 (06/15 0900) BP: (133-163)/(58-77) 139/70 mmHg (06/15 0900) SpO2:  [97 %-100 %] 100 % (06/15 0900) FiO2 (%):  [40 %] 40 % (06/15 0845) Weight:  [82.2 kg (181 lb 3.5 oz)] 82.2 kg (181 lb 3.5 oz) (06/15 0429)  General: in bed, intubated CV: RRR Mental Status: Patient opens eye partially to noxious stimuli. He does not follow commands.  He grimaces to pain Cranial Nerves: II: does not blink to threat. Pupils are small but equal, round, and reactive to light.  Discs are difficult to visualize. III,IV, VI: eyes are slightly dysconjugate with left eye elevated compared with right.  V,VII: corneals weak but present VIII, X, XI, XII: Unable to assess secondary to patient's altered mental status.  Motor: Extends arms bilaterally and withdraws legs to noxious stimuli.  Sensory: Responds to noxious stimuli in all 4 extremities.  Deep Tendon Reflexes: 2+ and symmetric in the biceps and patellae.   Cerebellar: Unable to assess secondary to patient's altered mental status.  Gait: Unable to assess secondary to patient's altered mental status.     I have reviewed labs in epic and the results pertinent to this consultation are: Hypokalemia, hypernatremia.   I have reviewed the images obtained:CT head from admission - no acute findings.   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 His current exam is strongly suggestive of anoxic brain injury, but it is not clear to me based on his exam at this time if he has a chance at recovery. I would favor continued aggressive care for at least seven days post-rewarming.   His EEG did show GPEDs which have been described as a poor prognostic indicator, but the background is reactive and therefore I am not certain that this is definitive. Given the epileptiform discharges, will start keppra.   Recommendations: 1) Keppra 1gm BID 2) Will continue to follow.    Ritta SlotMcNeill Rosevelt Luu, MD [facility] [phone]  If 7pm- 7am, please page neurology on call as listed in AMION.

## 2013-12-24 NOTE — Progress Notes (Addendum)
Subjective:  Intubated, withdraws to deep pain . On no drips   Objective:  Temp:  [100.4 F (38 C)-100.9 F (38.3 C)] 100.9 F (38.3 C) (06/15 0400) Pulse Rate:  [58-114] 84 (06/15 0700) Resp:  [25-35] 30 (06/15 0700) BP: (133-163)/(58-77) 142/68 mmHg (06/15 0700) SpO2:  [97 %-100 %] 100 % (06/15 0700) FiO2 (%):  [40 %] 40 % (06/15 0700) Weight:  [181 lb 3.5 oz (82.2 kg)] 181 lb 3.5 oz (82.2 kg) (06/15 0429) Weight change: 1 lb 12.2 oz (0.8 kg)  Intake/Output from previous day: 06/14 0701 - 06/15 0700 In: 1665 [I.V.:230; WI/OX:7353; IV Piggyback:50] Out: 1325 [Urine:1325]  Intake/Output from this shift:    Physical Exam: General appearance: alert and no distress Neck: no adenopathy, no carotid bruit, no JVD, supple, symmetrical, trachea midline and thyroid not enlarged, symmetric, no tenderness/mass/nodules Lungs: clear to auscultation bilaterally Heart: regular rate and rhythm, S1, S2 normal, no murmur, click, rub or gallop Extremities: extremities normal, atraumatic, no cyanosis or edema and right fem arterial puncture site OK  Lab Results: Results for orders placed during the hospital encounter of 12/21/13 (from the past 48 hour(s))  BASIC METABOLIC PANEL     Status: Abnormal   Collection Time    12/22/13 11:55 AM      Result Value Ref Range   Sodium 150 (*) 137 - 147 mEq/L   Potassium 3.9  3.7 - 5.3 mEq/L   Chloride 117 (*) 96 - 112 mEq/L   CO2 18 (*) 19 - 32 mEq/L   Glucose, Bld 88  70 - 99 mg/dL   BUN 13  6 - 23 mg/dL   Creatinine, Ser 0.68  0.50 - 1.35 mg/dL   Calcium 6.5 (*) 8.4 - 10.5 mg/dL   GFR calc non Af Amer >90  >90 mL/min   GFR calc Af Amer >90  >90 mL/min   Comment: (NOTE)     The eGFR has been calculated using the CKD EPI equation.     This calculation has not been validated in all clinical situations.     eGFR's persistently <90 mL/min signify possible Chronic Kidney     Disease.  GLUCOSE, CAPILLARY     Status: None   Collection Time      12/22/13 12:08 PM      Result Value Ref Range   Glucose-Capillary 71  70 - 99 mg/dL  LACTIC ACID, PLASMA     Status: None   Collection Time    12/22/13  2:33 PM      Result Value Ref Range   Lactic Acid, Venous 1.2  0.5 - 2.2 mmol/L  BASIC METABOLIC PANEL     Status: Abnormal   Collection Time    12/22/13  4:00 PM      Result Value Ref Range   Sodium 150 (*) 137 - 147 mEq/L   Potassium 3.9  3.7 - 5.3 mEq/L   Chloride 116 (*) 96 - 112 mEq/L   CO2 19  19 - 32 mEq/L   Glucose, Bld 82  70 - 99 mg/dL   BUN 13  6 - 23 mg/dL   Creatinine, Ser 0.78  0.50 - 1.35 mg/dL   Calcium 6.7 (*) 8.4 - 10.5 mg/dL   GFR calc non Af Amer >90  >90 mL/min   GFR calc Af Amer >90  >90 mL/min   Comment: (NOTE)     The eGFR has been calculated using the CKD EPI equation.  This calculation has not been validated in all clinical situations.     eGFR's persistently <90 mL/min signify possible Chronic Kidney     Disease.  GLUCOSE, CAPILLARY     Status: None   Collection Time    12/22/13  4:10 PM      Result Value Ref Range   Glucose-Capillary 75  70 - 99 mg/dL  GLUCOSE, CAPILLARY     Status: Abnormal   Collection Time    12/22/13  7:55 PM      Result Value Ref Range   Glucose-Capillary 53 (*) 70 - 99 mg/dL  GLUCOSE, CAPILLARY     Status: Abnormal   Collection Time    12/22/13  7:57 PM      Result Value Ref Range   Glucose-Capillary 63 (*) 70 - 99 mg/dL  GLUCOSE, CAPILLARY     Status: None   Collection Time    12/22/13  8:23 PM      Result Value Ref Range   Glucose-Capillary 82  70 - 99 mg/dL  GLUCOSE, CAPILLARY     Status: None   Collection Time    12/22/13  8:50 PM      Result Value Ref Range   Glucose-Capillary 80  70 - 99 mg/dL  GLUCOSE, CAPILLARY     Status: None   Collection Time    12/22/13 11:54 PM      Result Value Ref Range   Glucose-Capillary 79  70 - 99 mg/dL  GLUCOSE, CAPILLARY     Status: None   Collection Time    12/23/13  3:30 AM      Result Value Ref Range    Glucose-Capillary 92  70 - 99 mg/dL  CBC     Status: Abnormal   Collection Time    12/23/13  4:30 AM      Result Value Ref Range   WBC 10.8 (*) 4.0 - 10.5 K/uL   RBC 3.97 (*) 4.22 - 5.81 MIL/uL   Hemoglobin 13.3  13.0 - 17.0 g/dL   HCT 40.8  39.0 - 52.0 %   MCV 102.8 (*) 78.0 - 100.0 fL   MCH 33.5  26.0 - 34.0 pg   MCHC 32.6  30.0 - 36.0 g/dL   RDW 13.1  11.5 - 15.5 %   Platelets 125 (*) 150 - 400 K/uL  BASIC METABOLIC PANEL     Status: Abnormal   Collection Time    12/23/13  4:31 AM      Result Value Ref Range   Sodium 149 (*) 137 - 147 mEq/L   Potassium 3.6 (*) 3.7 - 5.3 mEq/L   Chloride 113 (*) 96 - 112 mEq/L   CO2 21  19 - 32 mEq/L   Glucose, Bld 119 (*) 70 - 99 mg/dL   BUN 12  6 - 23 mg/dL   Creatinine, Ser 0.83  0.50 - 1.35 mg/dL   Calcium 7.5 (*) 8.4 - 10.5 mg/dL   GFR calc non Af Amer >90  >90 mL/min   GFR calc Af Amer >90  >90 mL/min   Comment: (NOTE)     The eGFR has been calculated using the CKD EPI equation.     This calculation has not been validated in all clinical situations.     eGFR's persistently <90 mL/min signify possible Chronic Kidney     Disease.  GLUCOSE, CAPILLARY     Status: Abnormal   Collection Time    12/23/13  7:57 AM  Result Value Ref Range   Glucose-Capillary 108 (*) 70 - 99 mg/dL  GLUCOSE, CAPILLARY     Status: Abnormal   Collection Time    12/23/13 11:12 AM      Result Value Ref Range   Glucose-Capillary 122 (*) 70 - 99 mg/dL  GLUCOSE, CAPILLARY     Status: Abnormal   Collection Time    12/23/13  3:19 PM      Result Value Ref Range   Glucose-Capillary 144 (*) 70 - 99 mg/dL  GLUCOSE, CAPILLARY     Status: Abnormal   Collection Time    12/23/13  8:28 PM      Result Value Ref Range   Glucose-Capillary 117 (*) 70 - 99 mg/dL  GLUCOSE, CAPILLARY     Status: Abnormal   Collection Time    12/24/13 12:08 AM      Result Value Ref Range   Glucose-Capillary 123 (*) 70 - 99 mg/dL  BLOOD GAS, ARTERIAL     Status: Abnormal   Collection  Time    12/24/13  2:54 AM      Result Value Ref Range   FIO2 0.40     Delivery systems VENTILATOR     Mode PRESSURE REGULATED VOLUME CONTROL     VT 500     Rate 20     Peep/cpap 5.0     pH, Arterial 7.510 (*) 7.350 - 7.450   pCO2 arterial 29.7 (*) 35.0 - 45.0 mmHg   pO2, Arterial 74.9 (*) 80.0 - 100.0 mmHg   Bicarbonate 23.5  20.0 - 24.0 mEq/L   TCO2 24.4  0 - 100 mmol/L   Acid-Base Excess 0.7  0.0 - 2.0 mmol/L   O2 Saturation 98.7     Patient temperature 98.6     Collection site RIGHT RADIAL     Drawn by [phone]     Sample type ARTERIAL DRAW     Allens test (pass/fail) PASS  PASS  GLUCOSE, CAPILLARY     Status: Abnormal   Collection Time    12/24/13  3:31 AM      Result Value Ref Range   Glucose-Capillary 116 (*) 70 - 99 mg/dL  COMPREHENSIVE METABOLIC PANEL     Status: Abnormal   Collection Time    12/24/13  5:11 AM      Result Value Ref Range   Sodium 150 (*) 137 - 147 mEq/L   Potassium 2.9 (*) 3.7 - 5.3 mEq/L   Comment: CRITICAL RESULT CALLED TO, READ BACK BY AND VERIFIED WITH:     [facility] 12/24/13 0549 WAYK   Chloride 115 (*) 96 - 112 mEq/L   CO2 24  19 - 32 mEq/L   Glucose, Bld 123 (*) 70 - 99 mg/dL   BUN 19  6 - 23 mg/dL   Creatinine, Ser 0.83  0.50 - 1.35 mg/dL   Calcium 7.6 (*) 8.4 - 10.5 mg/dL   Total Protein 5.0 (*) 6.0 - 8.3 g/dL   Albumin 2.4 (*) 3.5 - 5.2 g/dL   AST 106 (*) 0 - 37 U/L   ALT 78 (*) 0 - 53 U/L   Alkaline Phosphatase 35 (*) 39 - 117 U/L   Total Bilirubin 0.6  0.3 - 1.2 mg/dL   GFR calc non Af Amer >90  >90 mL/min   GFR calc Af Amer >90  >90 mL/min   Comment: (NOTE)     The eGFR has been calculated using the CKD EPI equation.  This calculation has not been validated in all clinical situations.     eGFR's persistently <90 mL/min signify possible Chronic Kidney     Disease.  CBC WITH DIFFERENTIAL     Status: Abnormal   Collection Time    12/24/13  5:11 AM      Result Value Ref Range   WBC 8.9  4.0 - 10.5 K/uL   RBC 3.48 (*) 4.22 -  5.81 MIL/uL   Hemoglobin 11.5 (*) 13.0 - 17.0 g/dL   HCT 35.3 (*) 39.0 - 52.0 %   MCV 101.4 (*) 78.0 - 100.0 fL   MCH 33.0  26.0 - 34.0 pg   MCHC 32.6  30.0 - 36.0 g/dL   RDW 13.1  11.5 - 15.5 %   Platelets    150 - 400 K/uL   Value: PLATELET CLUMPS NOTED ON SMEAR, COUNT APPEARS DECREASED   Neutrophils Relative % 81 (*) 43 - 77 %   Neutro Abs 7.2  1.7 - 7.7 K/uL   Lymphocytes Relative 7 (*) 12 - 46 %   Lymphs Abs 0.6 (*) 0.7 - 4.0 K/uL   Monocytes Relative 12  3 - 12 %   Monocytes Absolute 1.0  0.1 - 1.0 K/uL   Eosinophils Relative 0  0 - 5 %   Eosinophils Absolute 0.0  0.0 - 0.7 K/uL   Basophils Relative 0  0 - 1 %   Basophils Absolute 0.0  0.0 - 0.1 K/uL    Imaging: Imaging results have been reviewed  Assessment/Plan:   1. Principal Problem: 2.   Cardiac arrest 3. Active Problems: 4.   Ventricular fibrillation 5.   Acute respiratory failure with hypoxia 6.   Encephalopathy acute 7.   ST elevation myocardial infarction (STEMI) of inferior wall 8.   CAD (coronary artery disease), native coronary artery 9.   Time Spent Directly with Patient:  25 minutes  Length of Stay:  LOS: 3 days  Pt admitted 6/12 with VF arrest. Found by co worker face down at [employer] where he worked. Apparently was without CPR for > 25 min. He was cooled [facility]) and taken to cath lab where he had a BMS to RCA. Left system had non critical disease. Trop 17. EF 15-20%. He is hemodynamically stable on no drips. NSR getting IV BB. No arrhythmias. K being repleted (K was 2.9). At this point cardiac stable. Breathing over vent (managed by PCCM). Major issue is neurologic. Apparently getting EEG today. Will follow closely with you. No signs of CHF at this time. On DAPT via OGT.   Lorretta Harp 12/24/2013, 8:18 AM

## 2013-12-24 NOTE — Progress Notes (Signed)
PULMONARY / CRITICAL CARE MEDICINE   Name: Darryl MulchJohn Zwick MRN: [id number] DOB: 03/29/1956    ADMISSION DATE:  12/21/2013 CONSULTATION DATE:  12/21/2013  REFERRING MD :  EDP PRIMARY SERVICE: PCCM  CHIEF COMPLAINT:  V-Fib Arrest  BRIEF PATIENT DESCRIPTION: 58 y.o. M with unknown PMH, brought to ED after suffering Vfib arrest.  In field received 4 shocks + 3 while in ED.  PCCM was consulted for initiation of hypothermia protocol / admission.  SIGNIFICANT EVENTS / STUDIES:  6/12 - suffered out of hospital vfib arrest. 6/12 Head CT: NAICP 6/12 Echo: LVEF 30%. Severely dilated LV cavity. Diffuse hypokinesis worse in the septum apex and distal anterior wall 6/12 LHC: angioplasty and bare metal stent to RCA. Diffuse CAD. Severely impaired LV function 6/15 EEG: consistent with a severe generalized nonspecific cerebral dysfunction. The generalized periodic discharges recorded can be ictal, but the modification with noxious stimulation and lack of associated fast activity as well as slow frequency all argue for a non-ictal nature to these discharges. There was no definite seizure recorded. This EEG is reactive to outside stimuli.   LINES / TUBES: A-line  6/12 >> 6/13  ETT 6/12 >>  Left IJ CVL 6/12 >>    CULTURES: Resp 6/16 >>   ANTIBIOTICS: Vanc 6/16 >>  Pip-tazo 6/16 >>   SUBJECTIVE:   Comatose  VITAL SIGNS: Temp:  [100.2 F (37.9 C)-100.9 F (38.3 C)] 100.9 F (38.3 C) (06/15 2000) Pulse Rate:  [58-108] 85 (06/15 2000) Resp:  [20-33] 23 (06/15 2000) BP: (134-182)/(58-84) 153/70 mmHg (06/15 2000) SpO2:  [97 %-100 %] 100 % (06/15 2000) FiO2 (%):  [40 %] 40 % (06/15 2000) Weight:  [82.2 kg (181 lb 3.5 oz)] 82.2 kg (181 lb 3.5 oz) (06/15 0429) HEMODYNAMICS: CVP:  [5 mmHg-11 mmHg] 5 mmHg VENTILATOR SETTINGS: Vent Mode:  [-] PRVC FiO2 (%):  [40 %] 40 % Set Rate:  [20 bmp] 20 bmp Vt Set:  [500 mL-550 mL] 550 mL PEEP:  [5 cmH20] 5 cmH20 Plateau Pressure:  [17 cmH20-20 cmH20] 18  cmH20 INTAKE / OUTPUT: Intake/Output     06/15 0701 - 06/16 0700   I.V. (mL/kg) 90 (1.1)   NG/GT 1000   IV Piggyback 510   Total Intake(mL/kg) 1600 (19.5)   Urine (mL/kg/hr) 1275 (1)   Total Output 1275   Net +325         PHYSICAL EXAMINATION: General: comatose Neuro: PERRL, EOMI, no spont movement HEENT: [place]/AT, sclerae anicteric. Cardiovascular: RRR, no M/R/G.  Lungs: B rhonchi Abdomen: BS x 4, soft, NT/ND.  Ext: no edema, warm    LABS: I have reviewed all of today's lab results. Relevant abnormalities are discussed in the A/P section  CXR: CM, R>L infiltrate/atx  ASSESSMENT / PLAN: Principal Problem:   Cardiac arrest Active Problems:   Ventricular fibrillation   Acute respiratory failure with hypoxia   Encephalopathy acute   ST elevation myocardial infarction (STEMI) of inferior wall   CAD (coronary artery disease), native coronary artery   DNR (do not resuscitate)   PULMONARY A: VDRF post arrest P:   Cont full vent support - settings reviewed and/or adjusted Cont vent bundle Daily SBT if/when meets criteria  CARDIOVASCULAR A:  Post VF arrest Diffuse CAD s/p RCA stent.  Left coronary vasospasm PAF, resolved P:  Cards following Metoprolol changed to enteral Monitor rhythm and BP  RENAL A:   AG metabolic acidosis, resovled P:   Monitor BMET intermittently Monitor I/Os Correct electrolytes as indicated  GASTROINTESTINAL A:   No issues P SUP: enteral PPI Cont TFs  HEMATOLOGIC A:  Mild anemia without overt blood loss P:  DVT px: SQ heparin Monitor CBC intermittently Transfuse per usual ICU guidelines  INFECTIOUS A:   Purulent ETT secretions Pulmonary infiltrate vs atx Low grade fever P:   Micro and abx as above  ENDOCRINE A:   Hyperglycemia without hx of DM, resolved P:   DC SSI Monitor glu on chem panels Resume SSI for glu > 180  NEUROLOGIC A:   Severe post anoxic encephalopathy P:   Neuro consult 6/15 Minimize  sedation If no improvement by end of week, consider W/D of support  SOCIAL: Pt has no family. There is a Radio broadcast assistantcoworker and his coworker's father who are purportedly his closest contacts. Although they seem very sincere in their concern for him and have offered to function as HCPOA decision makers, it is not clear to me that this responsibility should fall on their shoulders. Nor do I know with certainty the legality of this matter.   Have requested Ethics Consult  I have personally obtained a history, examined the patient, evaluated laboratory and imaging results, formulated the assessment and plan and placed orders. CRITICAL CARE: The patient is critically ill with multiple organ systems failure and requires high complexity decision making for assessment and support, frequent evaluation and titration of therapies, application of advanced monitoring technologies and extensive interpretation of multiple databases. Critical Care Time devoted to patient care services described in this note is 45 minutes.   Billy Fischeravid Simonds, MD ; [facility] service Mobile [phone].  After 5:30 PM or weekends, call [phone]  12/24/2013, 10:56 PM

## 2013-12-24 NOTE — Progress Notes (Signed)
Clinical Social Work Department BRIEF PSYCHOSOCIAL ASSESSMENT 12/24/2013  Patient:  Darryl MulchHARRIS,Darryl     Account Number:  [id number]     Admit date:  12/21/2013  Clinical Social Worker:  Varney BilesANDERSON,Elmarie Devlin, LCSWA  Date/Time:  12/24/2013 12:53 PM  Referred by:  Physician  Date Referred:  12/24/2013 Referred for  Competency/Guardianship   Other Referral:   Interview type:  Other - See comment Other interview type:   Medical team    PSYCHOSOCIAL DATA Living Status:  ALONE Admitted from facility:   Level of care:   Primary support name:  [name] [phone]) Primary support relationship to patient:  FRIEND Degree of support available:   Poor--pt has support from few friends and manager at work.    CURRENT CONCERNS Current Concerns  Other - See comment   Other Concerns:   Pt unable to make medical decisions    SOCIAL WORK ASSESSMENT / PLAN CSW informed of pt's case by medical staff. Pt found down at work, after approx. 25 minutes. Pt has minimal social support--no family, ex-wife (friends unable how to locate her/who she is). Pt's manager at work is closest contact. CSW contacted ethics committee to discuss case and medical decision-making. Ethics committee confirmed that it is ultimately the physicians' responsibility to make medical decisions for pt and determine ultimate likelihood of recovery/what treatment is best for pt. Pt's friends/non-relative contacts are allowed to give guidance to physicians and inform to the best of their ability concerning what pt would want. If physician believes pt prognosis is futile, a formal consult with a second physician in a separate practice of that of the primary care doctor must be documented. Questions about this can be directed to the ethics committee directly: [phone].   Assessment/plan status:  Psychosocial Support/Ongoing Assessment of Needs Other assessment/ plan:   Information/referral to community resources:   Ethics call     PATIENT'S/FAMILY'S RESPONSE TO PLAN OF CARE: N/A       Darryl LabradorJulie Mays Isabell, MSW, [facility] Hospital[facility]WA Clinical Social Worker [phone]

## 2013-12-24 NOTE — Procedures (Signed)
History: 58 yo M s/p cardiac arrest.   Sedation: None  Technique: This is a 17 channel routine scalp EEG performed at the bedside with bipolar and monopolar montages arranged in accordance to the international 10/20 system of electrode placement. One channel was dedicated to EKG recording.    Background: The background in onset consists of generalized periodic discharges occurring at a frequency of approximately 1 Hz. These usually have an epileptiform morphology with a amplitude predominance of initial peak, but sometimes appear more triphasic in morphology with a predominance of the positive component. They are generalized with a bifrontal predominance. There are also occasional short (less than 1 second) runs of 4-5 Hz activity with delta morphology.  With noxious stimulation, this pattern is aborted and replaced with generalized irregular delta activity. There is no reactivity to eye opening/closure.   Reliably, noxious stimulation aborts these discharges.  During the EEG, Ativan is administered with resolution of the periodic discharges. Following Ativan, there is irregular delta and theta activity with frontal predominance and some periods of suppression in between. Noxious stimulation aborts this pattern.  Photic stimulation: Physiologic driving is not performed  EEG Abnormalities: 1) generalized periodic discharges at times with epileptiform and times more triphasic morphology. 2) generalized irregular slow activity  Clinical Interpretation: This EEG is consistent with a severe generalized nonspecific cerebral dysfunction. The generalized periodic discharges recorded can be ictal, but the modification with noxious stimulation and lack of associated fast activity as well as slow frequency all argue for a non-ictal nature to these discharges.  There was no definite seizure recorded. This EEG is reactive to outside stimuli.  Ritta SlotMcNeill Lavren Lewan, MD [facility] [phone]  If 7pm- 7am, please page neurology on call as listed in AMION.

## 2013-12-24 NOTE — Consult Note (Signed)
ANTIBIOTIC CONSULT NOTE - INITIAL  Pharmacy Consult for Vancomycin and Zosyn Indication: pneumonia  No Known Allergies  Patient Measurements: Height: 6\' 1"  (185.4 cm) Weight: 181 lb 3.5 oz (82.2 kg) IBW/kg (Calculated) : 79.9  Vital Signs: Temp: 100.9 F (38.3 C) (06/15 0400) Temp src: Oral (06/15 0000) BP: 138/65 mmHg (06/15 1000) Pulse Rate: 89 (06/15 1000) Intake/Output from previous day: 06/14 0701 - 06/15 0700 In: 1740 [I.V.:240; NG/GT:1450; IV Piggyback:50] Out: 1325 [Urine:1325] Intake/Output from this shift: Total I/O In: 225 [I.V.:30; NG/GT:195] Out: 350 [Urine:350]  Labs:  Recent Labs  12/22/13 0350  12/22/13 1600 12/23/13 0430 12/23/13 0431 12/24/13 0511  WBC 12.9*  --   --  10.8*  --  8.9  HGB 14.9  --   --  13.3  --  11.5*  PLT 126*  --   --  125*  --  PLATELET CLUMPS NOTED ON SMEAR, COUNT APPEARS DECREASED  CREATININE 0.62  < > 0.78  --  0.83 0.83  < > = values in this interval not displayed. Estimated Creatinine Clearance: 112.3 ml/min (by C-G formula based on Cr of 0.83).   Microbiology: Recent Results (from the past 720 hour(s))  MRSA PCR SCREENING     Status: None   Collection Time    12/21/13  3:20 AM      Result Value Ref Range Status   MRSA by PCR NEGATIVE  NEGATIVE Final   Comment:            The GeneXpert MRSA Assay (FDA     approved for NASAL specimens     only), is one component of a     comprehensive MRSA colonization     surveillance program. It is not     intended to diagnose MRSA     infection nor to guide or     monitor treatment for     MRSA infections.    Medical History: History reviewed. No pertinent past medical history.  Assessment: 57yom admitted 6/12 s/p vfib arrest. He is now febrile with CXR showing early pneumonia in the mid lung region. He will begin empiric vancomycin and zosyn to cover for HCAP. Renal function is stable.  Goal of Therapy:  Vancomycin trough level 15-20 mcg/ml  Plan:  1) Vancomycin 1g  IV q8 2) Zosyn 3.375g IV q8 (4 hour infusion) 3) Follow renal function, cultures, LOT, level at steady state  Fredrik RiggerMarkle, Kimberli Winne Sue 12/24/2013,11:01 AM

## 2013-12-24 NOTE — Progress Notes (Signed)
eLink Physician-Brief Progress Note Patient Name: Darryl MulchJohn Mays DOB: 12/18/1955 MRN: [id number]  Date of Service  12/24/2013   HPI/Events of Note     eICU Interventions  Hypokalemia, repleted    Intervention Category Minor Interventions: Electrolytes abnormality - evaluation and management  MCQUAID, DOUGLAS 12/24/2013, 5:52 AM

## 2013-12-24 NOTE — Progress Notes (Signed)
EEG Completed; Results Pending  

## 2013-12-25 ENCOUNTER — Inpatient Hospital Stay (HOSPITAL_COMMUNITY): Payer: Medicaid Other

## 2013-12-25 DIAGNOSIS — I469 Cardiac arrest, cause unspecified: Secondary | ICD-10-CM

## 2013-12-25 LAB — GLUCOSE, CAPILLARY
GLUCOSE-CAPILLARY: 137 mg/dL — AB (ref 70–99)
Glucose-Capillary: 112 mg/dL — ABNORMAL HIGH (ref 70–99)
Glucose-Capillary: 120 mg/dL — ABNORMAL HIGH (ref 70–99)
Glucose-Capillary: 125 mg/dL — ABNORMAL HIGH (ref 70–99)
Glucose-Capillary: 135 mg/dL — ABNORMAL HIGH (ref 70–99)
Glucose-Capillary: 147 mg/dL — ABNORMAL HIGH (ref 70–99)

## 2013-12-25 LAB — COMPREHENSIVE METABOLIC PANEL
ALT: 55 U/L — ABNORMAL HIGH (ref 0–53)
AST: 93 U/L — ABNORMAL HIGH (ref 0–37)
Albumin: 2.4 g/dL — ABNORMAL LOW (ref 3.5–5.2)
Alkaline Phosphatase: 32 U/L — ABNORMAL LOW (ref 39–117)
BUN: 16 mg/dL (ref 6–23)
CALCIUM: 7.4 mg/dL — AB (ref 8.4–10.5)
CHLORIDE: 111 meq/L (ref 96–112)
CO2: 23 mEq/L (ref 19–32)
CREATININE: 0.79 mg/dL (ref 0.50–1.35)
GFR calc non Af Amer: >90 mL/min (ref >90)
GLUCOSE: 120 mg/dL — AB (ref 70–99)
Potassium: 3.4 mEq/L — ABNORMAL LOW (ref 3.7–5.3)
Sodium: 147 mEq/L (ref 137–147)
Total Bilirubin: 0.6 mg/dL (ref 0.3–1.2)
Total Protein: 5.1 g/dL — ABNORMAL LOW (ref 6.0–8.3)

## 2013-12-25 LAB — PROCALCITONIN: Procalcitonin: 0.42 ng/mL

## 2013-12-25 LAB — CBC
HEMATOCRIT: 32 % — AB (ref 39.0–52.0)
HEMOGLOBIN: 10.6 g/dL — AB (ref 13.0–17.0)
MCH: 33.4 pg (ref 26.0–34.0)
MCHC: 33.1 g/dL (ref 30.0–36.0)
MCV: 100.9 fL — AB (ref 78.0–100.0)
PLATELETS: 102 10*3/uL — AB (ref 150–400)
RBC: 3.17 MIL/uL — AB (ref 4.22–5.81)
RDW: 13.1 % (ref 11.5–15.5)
WBC: 8 10*3/uL (ref 4.0–10.5)

## 2013-12-25 MED ORDER — FENTANYL CITRATE 0.05 MG/ML IJ SOLN
25.0000 ug | INTRAMUSCULAR | Status: DC | PRN
  Administered 2013-12-25: 25 ug via INTRAVENOUS
  Administered 2013-12-26 – 2013-12-27 (×6): 50 ug via INTRAVENOUS
  Filled 2013-12-25 (×7): qty 2

## 2013-12-25 MED ORDER — GADOBENATE DIMEGLUMINE 529 MG/ML IV SOLN
20.0000 mL | Freq: Once | INTRAVENOUS | Status: AC | PRN
  Administered 2013-12-25: 18 mL via INTRAVENOUS

## 2013-12-25 MED ORDER — POTASSIUM CHLORIDE 20 MEQ/15ML (10%) PO LIQD
20.0000 meq | ORAL | Status: AC
  Administered 2013-12-25 (×2): 20 meq
  Filled 2013-12-25 (×2): qty 15

## 2013-12-25 NOTE — Clinical Documentation Improvement (Signed)
Possible Clinical Conditions?    Aspiration Pneumonia (POA?) Gram Negative Pneumonia (POA?)  Bacterial pneumonia, specify type if known (POA?) Klebsiella PNA E Coli PNA Pseudomonas PNA Candidiasis PNA Staph/MRSA PNA Strep PNA Pneumococcal PNA H Influenza PNA H Para Influenza PNA  Pneumonia (CAP, HAP) (POA?) Other Condition Cannot Clinically Determine      Risk Factors: Per 6/15 Pharmacy consult:  Early pneumonia in mid lung region, empiric Vanc/Zosyn to cover HCAP.  Diagnostics: 6/14:  CXR:  New infiltrate left mid lung-?early pneumonia left mid lung region.  Treatment: 6/15:  Empiric Vanc/Zosyn to cover HCAP.   Thank You, Marciano SequinWanda Mathews-Bethea,RN,BSN, Clinical Documentation Specialist:  [phone]  [facility]- Health Information Management

## 2013-12-25 NOTE — Progress Notes (Signed)
[facility] FOR AM LAB REPLACEMENT ONLY  The patient does apply for the [facility] Electroly[facility] Protocol based on the criteria listed below:   1. Is GFR >/= 40 ml/min? yes  Patient's GFR today is >90 2. Is urine output >/= 0.5 ml/kg/hr for the last 6 hours? yes Patient's UOP is 1.3 ml/kg/hr 3. Is BUN < 60 mg/dL? yes  Patient's BUN today is 16 4. Abnormal electrolyte(s): K m3.4 5. Ordered repletion with: per protocol 6. If a panic level lab has been reported, has the CCM MD in charge been notified? no.   Physician:    Markus DaftWHELAN, GRETCHIN A 12/25/2013 6:44 AM

## 2013-12-25 NOTE — Progress Notes (Addendum)
Subjective: No significant changes.   Exam: Filed Vitals:   12/25/13 0745  BP:   Pulse:   Temp: 100.5 F (38.1 C)  Resp:    Gen: In bed, NAD MS: Does not open eyes, but does tightly shut eyes to noxious stimuli and passive eye opening.  CN: PERRL, corneals weak but present bilaterally.  Motor: Withdraws vs flexion in all 4 extremities. Does not localize Sensory:as above.    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 His current exam is strongly suggestive of anoxic brain injury, but it is not clear to me based on his exam at this time that he has no chance at recovery. I would favor continued aggressive care for at least seven days post-rewarming.   His EEG did show GPDs which have been described as a poor prognostic indicator, but the background is reactive and therefore I am not certain that this is definitive. Given the epileptiform discharges, will start keppra.    Recommendations: 1) Continue keppra 2) will continue to follow. Even if is exam is similar at 7 days, will be difficult to be definitive about prognosis.  3) MRI brain   Ritta SlotMcNeill Kirkpatrick, MD [facility] [phone]  If 7pm- 7am, please page neurology on call as listed in AMION.

## 2013-12-25 NOTE — Progress Notes (Signed)
Subjective:  Intubated, responds to noxious stimuli  Objective:  Temp:  [98.5 F (36.9 C)-100.9 F (38.3 C)] 100.5 F (38.1 C) (06/16 0745) Pulse Rate:  [52-105] 73 (06/16 0700) Resp:  [17-33] 20 (06/16 0700) BP: (117-182)/(54-84) 125/66 mmHg (06/16 0700) SpO2:  [100 %] 100 % (06/16 0700) FiO2 (%):  [40 %] 40 % (06/16 0323) Weight:  [183 lb 3.2 oz (83.1 kg)] 183 lb 3.2 oz (83.1 kg) (06/16 0500) Weight change: 1 lb 15.8 oz (0.9 kg)  Intake/Output from previous day: 06/15 0701 - 06/16 0700 In: 3415 [I.V.:170; EV/OJ:5009; IV Piggyback:1120] Out: 2075 [Urine:2075]  Intake/Output from this shift:    Physical Exam: General appearance: Obtunded Neck: no adenopathy, no carotid bruit, no JVD, supple, symmetrical, trachea midline and thyroid not enlarged, symmetric, no tenderness/mass/nodules Lungs: Rhonchi right lung Heart: regular rate and rhythm, S1, S2 normal, no murmur, click, rub or gallop Extremities: extremities normal, atraumatic, no cyanosis or edema  Lab Results: Results for orders placed during the hospital encounter of 12/21/13 (from the past 48 hour(s))  GLUCOSE, CAPILLARY     Status: Abnormal   Collection Time    12/23/13 11:12 AM      Result Value Ref Range   Glucose-Capillary 122 (*) 70 - 99 mg/dL  GLUCOSE, CAPILLARY     Status: Abnormal   Collection Time    12/23/13  3:19 PM      Result Value Ref Range   Glucose-Capillary 144 (*) 70 - 99 mg/dL  GLUCOSE, CAPILLARY     Status: Abnormal   Collection Time    12/23/13  8:28 PM      Result Value Ref Range   Glucose-Capillary 117 (*) 70 - 99 mg/dL  GLUCOSE, CAPILLARY     Status: Abnormal   Collection Time    12/24/13 12:08 AM      Result Value Ref Range   Glucose-Capillary 123 (*) 70 - 99 mg/dL  BLOOD GAS, ARTERIAL     Status: Abnormal   Collection Time    12/24/13  2:54 AM      Result Value Ref Range   FIO2 0.40     Delivery systems VENTILATOR     Mode PRESSURE REGULATED VOLUME CONTROL     VT 500      Rate 20     Peep/cpap 5.0     pH, Arterial 7.510 (*) 7.350 - 7.450   pCO2 arterial 29.7 (*) 35.0 - 45.0 mmHg   pO2, Arterial 74.9 (*) 80.0 - 100.0 mmHg   Bicarbonate 23.5  20.0 - 24.0 mEq/L   TCO2 24.4  0 - 100 mmol/L   Acid-Base Excess 0.7  0.0 - 2.0 mmol/L   O2 Saturation 98.7     Patient temperature 98.6     Collection site RIGHT RADIAL     Drawn by [phone]     Sample type ARTERIAL DRAW     Allens test (pass/fail) PASS  PASS  GLUCOSE, CAPILLARY     Status: Abnormal   Collection Time    12/24/13  3:31 AM      Result Value Ref Range   Glucose-Capillary 116 (*) 70 - 99 mg/dL  COMPREHENSIVE METABOLIC PANEL     Status: Abnormal   Collection Time    12/24/13  5:11 AM      Result Value Ref Range   Sodium 150 (*) 137 - 147 mEq/L   Potassium 2.9 (*) 3.7 - 5.3 mEq/L   Comment: CRITICAL RESULT CALLED TO, READ  BACK BY AND VERIFIED WITH:     [place] [facility] 12/24/13 0549 WAYK   Chloride 115 (*) 96 - 112 mEq/L   CO2 24  19 - 32 mEq/L   Glucose, Bld 123 (*) 70 - 99 mg/dL   BUN 19  6 - 23 mg/dL   Creatinine, Ser 0.83  0.50 - 1.35 mg/dL   Calcium 7.6 (*) 8.4 - 10.5 mg/dL   Total Protein 5.0 (*) 6.0 - 8.3 g/dL   Albumin 2.4 (*) 3.5 - 5.2 g/dL   AST 106 (*) 0 - 37 U/L   ALT 78 (*) 0 - 53 U/L   Alkaline Phosphatase 35 (*) 39 - 117 U/L   Total Bilirubin 0.6  0.3 - 1.2 mg/dL   GFR calc non Af Amer >90  >90 mL/min   GFR calc Af Amer >90  >90 mL/min   Comment: (NOTE)     The eGFR has been calculated using the CKD EPI equation.     This calculation has not been validated in all clinical situations.     eGFR's persistently <90 mL/min signify possible Chronic Kidney     Disease.  CBC WITH DIFFERENTIAL     Status: Abnormal   Collection Time    12/24/13  5:11 AM      Result Value Ref Range   WBC 8.9  4.0 - 10.5 K/uL   RBC 3.48 (*) 4.22 - 5.81 MIL/uL   Hemoglobin 11.5 (*) 13.0 - 17.0 g/dL   HCT 35.3 (*) 39.0 - 52.0 %   MCV 101.4 (*) 78.0 - 100.0 fL   MCH 33.0  26.0 - 34.0 pg   MCHC 32.6   30.0 - 36.0 g/dL   RDW 13.1  11.5 - 15.5 %   Platelets    150 - 400 K/uL   Value: PLATELET CLUMPS NOTED ON SMEAR, COUNT APPEARS DECREASED   Neutrophils Relative % 81 (*) 43 - 77 %   Neutro Abs 7.2  1.7 - 7.7 K/uL   Lymphocytes Relative 7 (*) 12 - 46 %   Lymphs Abs 0.6 (*) 0.7 - 4.0 K/uL   Monocytes Relative 12  3 - 12 %   Monocytes Absolute 1.0  0.1 - 1.0 K/uL   Eosinophils Relative 0  0 - 5 %   Eosinophils Absolute 0.0  0.0 - 0.7 K/uL   Basophils Relative 0  0 - 1 %   Basophils Absolute 0.0  0.0 - 0.1 K/uL  GLUCOSE, CAPILLARY     Status: Abnormal   Collection Time    12/24/13  7:41 AM      Result Value Ref Range   Glucose-Capillary 130 (*) 70 - 99 mg/dL  PROCALCITONIN     Status: None   Collection Time    12/24/13 11:00 AM      Result Value Ref Range   Procalcitonin 0.71     Comment:            Interpretation:     PCT > 0.5 ng/mL and <= 2 ng/mL:     Systemic infection (sepsis) is possible,     but other conditions are known to elevate     PCT as well.     (NOTE)             ICU PCT Algorithm               Non ICU PCT Algorithm        ----------------------------     ------------------------------  PCT < 0.25 ng/mL                 PCT < 0.1 ng/mL         Stopping of antibiotics            Stopping of antibiotics           strongly encouraged.               strongly encouraged.        ----------------------------     ------------------------------           PCT level decrease by               PCT < 0.25 ng/mL           >= 80% from peak PCT           OR PCT 0.25 - 0.5 ng/mL          Stopping of antibiotics                                                 encouraged.         Stopping of antibiotics               encouraged.        ----------------------------     ------------------------------           PCT level decrease by              PCT >= 0.25 ng/mL           < 80% from peak PCT            AND PCT >= 0.5 ng/mL            Continuing antibiotics                                                   encouraged.           Continuing antibiotics                encouraged.        ----------------------------     ------------------------------         PCT level increase compared          PCT > 0.5 ng/mL             with peak PCT AND              PCT >= 0.5 ng/mL             Escalation of antibiotics                                              strongly encouraged.          Escalation of antibiotics            strongly encouraged.  GLUCOSE, CAPILLARY     Status: Abnormal   Collection Time    12/24/13 11:50 AM      Result Value Ref Range   Glucose-Capillary 123 (*) 70 -  99 mg/dL  GLUCOSE, CAPILLARY     Status: Abnormal   Collection Time    12/24/13 12:14 PM      Result Value Ref Range   Glucose-Capillary 122 (*) 70 - 99 mg/dL  CULTURE, RESPIRATORY (NON-EXPECTORATED)     Status: None   Collection Time    12/24/13  1:00 PM      Result Value Ref Range   Specimen Description TRACHEAL ASPIRATE     Special Requests NONE     Gram Stain       Value: RARE WBC PRESENT,BOTH PMN AND MONONUCLEAR     NO SQUAMOUS EPITHELIAL CELLS SEEN     RARE GRAM POSITIVE COCCI IN PAIRS     Performed at [facility]   Culture PENDING     Report Status PENDING    GLUCOSE, CAPILLARY     Status: Abnormal   Collection Time    12/24/13  3:59 PM      Result Value Ref Range   Glucose-Capillary 125 (*) 70 - 99 mg/dL  GLUCOSE, CAPILLARY     Status: Abnormal   Collection Time    12/24/13  8:07 PM      Result Value Ref Range   Glucose-Capillary 117 (*) 70 - 99 mg/dL  GLUCOSE, CAPILLARY     Status: Abnormal   Collection Time    12/24/13 11:23 PM      Result Value Ref Range   Glucose-Capillary 137 (*) 70 - 99 mg/dL  GLUCOSE, CAPILLARY     Status: Abnormal   Collection Time    12/25/13  4:31 AM      Result Value Ref Range   Glucose-Capillary 112 (*) 70 - 99 mg/dL  CBC     Status: Abnormal   Collection Time    12/25/13  5:00 AM      Result Value Ref Range   WBC  8.0  4.0 - 10.5 K/uL   RBC 3.17 (*) 4.22 - 5.81 MIL/uL   Hemoglobin 10.6 (*) 13.0 - 17.0 g/dL   HCT 32.0 (*) 39.0 - 52.0 %   MCV 100.9 (*) 78.0 - 100.0 fL   MCH 33.4  26.0 - 34.0 pg   MCHC 33.1  30.0 - 36.0 g/dL   RDW 13.1  11.5 - 15.5 %   Platelets 102 (*) 150 - 400 K/uL   Comment: SPECIMEN CHECKED FOR CLOTS     REPEATED TO VERIFY     PLATELET COUNT CONFIRMED BY SMEAR  COMPREHENSIVE METABOLIC PANEL     Status: Abnormal   Collection Time    12/25/13  5:00 AM      Result Value Ref Range   Sodium 147  137 - 147 mEq/L   Potassium 3.4 (*) 3.7 - 5.3 mEq/L   Chloride 111  96 - 112 mEq/L   CO2 23  19 - 32 mEq/L   Glucose, Bld 120 (*) 70 - 99 mg/dL   BUN 16  6 - 23 mg/dL   Creatinine, Ser 0.79  0.50 - 1.35 mg/dL   Calcium 7.4 (*) 8.4 - 10.5 mg/dL   Total Protein 5.1 (*) 6.0 - 8.3 g/dL   Albumin 2.4 (*) 3.5 - 5.2 g/dL   AST 93 (*) 0 - 37 U/L   ALT 55 (*) 0 - 53 U/L   Alkaline Phosphatase 32 (*) 39 - 117 U/L   Total Bilirubin 0.6  0.3 - 1.2 mg/dL   GFR calc non Af Amer >90  >90 mL/min  GFR calc Af Amer >90  >90 mL/min   Comment: (NOTE)     The eGFR has been calculated using the CKD EPI equation.     This calculation has not been validated in all clinical situations.     eGFR's persistently <90 mL/min signify possible Chronic Kidney     Disease.  PROCALCITONIN     Status: None   Collection Time    12/25/13  5:00 AM      Result Value Ref Range   Procalcitonin 0.42     Comment:            Interpretation:     PCT (Procalcitonin) <= 0.5 ng/mL:     Systemic infection (sepsis) is not likely.     Local bacterial infection is possible.     (NOTE)             ICU PCT Algorithm               Non ICU PCT Algorithm        ----------------------------     ------------------------------             PCT < 0.25 ng/mL                 PCT < 0.1 ng/mL         Stopping of antibiotics            Stopping of antibiotics           strongly encouraged.               strongly encouraged.         ----------------------------     ------------------------------           PCT level decrease by               PCT < 0.25 ng/mL           >= 80% from peak PCT           OR PCT 0.25 - 0.5 ng/mL          Stopping of antibiotics                                                 encouraged.         Stopping of antibiotics               encouraged.        ----------------------------     ------------------------------           PCT level decrease by              PCT >= 0.25 ng/mL           < 80% from peak PCT            AND PCT >= 0.5 ng/mL            Continuing antibiotics                                                  encouraged.           Continuing antibiotics  encouraged.        ----------------------------     ------------------------------         PCT level increase compared          PCT > 0.5 ng/mL             with peak PCT AND              PCT >= 0.5 ng/mL             Escalation of antibiotics                                              strongly encouraged.          Escalation of antibiotics            strongly encouraged.    Imaging: Imaging results have been reviewed  Assessment/Plan:   1. Principal Problem: 2.   Cardiac arrest 3. Active Problems: 4.   Ventricular fibrillation 5.   Acute respiratory failure with hypoxia 6.   Encephalopathy acute 7.   ST elevation myocardial infarction (STEMI) of inferior wall 8.   CAD (coronary artery disease), native coronary artery 9.   DNR (do not resuscitate) 10.   Time Spent Directly with Patient:  30 minutes  Length of Stay:  LOS: 4 days   Day 4 VF arrest, Inf STEMI/PCI with ISCM. Hemodynamically stable on no pressors. Vent management per PCCM. NSR. [place] on 40% FIO2. Major issues now are neuro recovery. Appreciate thoughtful input of Kirpatrick. EEG non specific but does not suggest irreversible anoxic encephalopathy. He does W/D to noxious stimuli .Exam otherwise is notable for rhonchi right lung. Getting  copius amount of yellowish sputum with suction. On Vanc and Zosyn. Pt is 5 liters + I/O. Agree with Ethics consult. No family present. PCCM coordinating whether to discuss withdrawal of support vs trach and PEG. Awaiting 7 days post insult to readdress.  Lorretta Harp 12/25/2013, 8:27 AM

## 2013-12-25 NOTE — Progress Notes (Signed)
PULMONARY / CRITICAL CARE MEDICINE   Name: Darryl MulchJohn Frese MRN: [id number] DOB: 10/16/1955    ADMISSION DATE:  12/21/2013 CONSULTATION DATE:  12/21/2013  REFERRING MD :  EDP PRIMARY SERVICE: PCCM  CHIEF COMPLAINT:  V-Fib Arrest  BRIEF PATIENT DESCRIPTION: 58 y.o. M with unknown PMH, brought to ED after suffering Vfib arrest.  In field received 4 shocks + 3 while in ED.  PCCM was consulted for initiation of hypothermia protocol / admission.  SIGNIFICANT EVENTS / STUDIES:  6/12 - suffered out of hospital vfib arrest. 6/12 Head CT: NAICP 6/12 Echo: LVEF 30%. Severely dilated LV cavity. Diffuse hypokinesis worse in the septum apex and distal anterior wall 6/12 LHC: angioplasty and bare metal stent to RCA. Diffuse CAD. Severely impaired LV function 6/15 EEG: consistent with a severe generalized nonspecific cerebral dysfunction. The generalized periodic discharges recorded can be ictal, but the modification with noxious stimulation and lack of associated fast activity as well as slow frequency all argue for a non-ictal nature to these discharges. There was no definite seizure recorded. This EEG is reactive to outside stimuli.   LINES / TUBES: A-line  6/12 >> 6/13  ETT 6/12 >>  Left IJ CVL 6/12 >>    CULTURES: Resp 6/16 >>   ANTIBIOTICS: Vanc 6/16 >>  Pip-tazo 6/16 >>   SUBJECTIVE:   Comatose  VITAL SIGNS: Temp:  [98.5 F (36.9 C)-100.9 F (38.3 C)] 98.5 F (36.9 C) (06/16 1125) Pulse Rate:  [52-128] 69 (06/16 1540) Resp:  [17-29] 25 (06/16 1540) BP: (113-156)/(54-74) 113/54 mmHg (06/16 1300) SpO2:  [97 %-100 %] 98 % (06/16 1540) FiO2 (%):  [40 %] 40 % (06/16 1540) Weight:  [83.1 kg (183 lb 3.2 oz)] 83.1 kg (183 lb 3.2 oz) (06/16 0500) HEMODYNAMICS:   VENTILATOR SETTINGS: Vent Mode:  [-] PRVC FiO2 (%):  [40 %] 40 % Set Rate:  [16 bmp-20 bmp] 16 bmp Vt Set:  [550 mL] 550 mL PEEP:  [5 cmH20] 5 cmH20 Pressure Support:  [8 cmH20] 8 cmH20 Plateau Pressure:  [16 cmH20-18  cmH20] 16 cmH20 INTAKE / OUTPUT: Intake/Output     06/15 0701 - 06/16 0700 06/16 0701 - 06/17 0700   I.V. (mL/kg) 180 (2.2) 70 (0.8)   NG/GT 2190 505   IV Piggyback 1120 350   Total Intake(mL/kg) 3490 (42) 925 (11.1)   Urine (mL/kg/hr) 2075 (1) 500 (0.7)   Total Output 2075 500   Net +1415 +425        Stool Occurrence  1 x     PHYSICAL EXAMINATION: General: comatose Neuro: PERRL, EOMI, no spont movement HEENT: [place]/AT, sclerae anicteric. Cardiovascular: RRR, no M/R/G.  Lungs: B rhonchi Abdomen: BS x 4, soft, NT/ND.  Ext: no edema, warm    LABS: I have reviewed all of today's lab results. Relevant abnormalities are discussed in the A/P section  CXR: improved aeration RLL  ASSESSMENT / PLAN: PULMONARY A: VDRF post arrest P:   Cont full vent support - settings reviewed and/or adjusted Cont vent bundle Daily SBT if/when meets criteria  CARDIOVASCULAR A:  Post VF arrest Diffuse CAD s/p RCA stent.   Coronary vasospasm, resolved PAF, resolved P:  Cards following Cont metoprolol Monitor rhythm and BP  RENAL A:   AG metabolic acidosis, resolved P:   Monitor BMET intermittently Monitor I/Os Correct electrolytes as indicated  GASTROINTESTINAL A:   No issues P SUP: enteral PPI Cont TFs  HEMATOLOGIC A:  Mild anemia without overt blood loss P:  DVT px:  SQ heparin Monitor CBC intermittently Transfuse per usual ICU guidelines  INFECTIOUS A:   Fever 6/15 Purulent bronchitis vs PNA - PCT minimally elevated 6/16 P:   Micro and abx as above  ENDOCRINE A:   Hyperglycemia without hx of DM, resolved P:   DC SSI Monitor glu on chem panels Resume SSI for glu > 180  NEUROLOGIC A:   Severe post anoxic encephalopathy P:   Neuro consult 6/15 Minimize sedation If no improvement by end of week, consider W/D of support    I have personally obtained a history, examined the patient, evaluated laboratory and imaging results, formulated the assessment and  plan and placed orders. CRITICAL CARE: The patient is critically ill with multiple organ systems failure and requires high complexity decision making for assessment and support, frequent evaluation and titration of therapies, application of advanced monitoring technologies and extensive interpretation of multiple databases. Critical Care Time devoted to patient care services described in this note is 35 minutes.   Billy Fischeravid Simonds, MD ; [facility] Mobile [phone].  After 5:30 PM or weekends, call [phone]  12/25/2013, 3:46 PM

## 2013-12-26 ENCOUNTER — Inpatient Hospital Stay (HOSPITAL_COMMUNITY): Payer: Medicaid Other

## 2013-12-26 LAB — PROCALCITONIN: Procalcitonin: 0.25 ng/mL

## 2013-12-26 LAB — BASIC METABOLIC PANEL
BUN: 15 mg/dL (ref 6–23)
CHLORIDE: 111 meq/L (ref 96–112)
CO2: 24 mEq/L (ref 19–32)
CREATININE: 0.66 mg/dL (ref 0.50–1.35)
Calcium: 7.4 mg/dL — ABNORMAL LOW (ref 8.4–10.5)
GFR calc Af Amer: >90 mL/min (ref >90)
GFR calc non Af Amer: >90 mL/min (ref >90)
GLUCOSE: 132 mg/dL — AB (ref 70–99)
POTASSIUM: 3.4 meq/L — AB (ref 3.7–5.3)
Sodium: 146 mEq/L (ref 137–147)

## 2013-12-26 LAB — CBC
HEMATOCRIT: 31.7 % — AB (ref 39.0–52.0)
Hemoglobin: 10.3 g/dL — ABNORMAL LOW (ref 13.0–17.0)
MCH: 33 pg (ref 26.0–34.0)
MCHC: 32.5 g/dL (ref 30.0–36.0)
MCV: 101.6 fL — ABNORMAL HIGH (ref 78.0–100.0)
Platelets: 114 10*3/uL — ABNORMAL LOW (ref 150–400)
RBC: 3.12 MIL/uL — ABNORMAL LOW (ref 4.22–5.81)
RDW: 13.5 % (ref 11.5–15.5)
WBC: 7.6 10*3/uL (ref 4.0–10.5)

## 2013-12-26 LAB — CULTURE, RESPIRATORY

## 2013-12-26 LAB — CULTURE, RESPIRATORY W GRAM STAIN

## 2013-12-26 LAB — GLUCOSE, CAPILLARY
GLUCOSE-CAPILLARY: 148 mg/dL — AB (ref 70–99)
GLUCOSE-CAPILLARY: 137 mg/dL — AB (ref 70–99)
Glucose-Capillary: 122 mg/dL — ABNORMAL HIGH (ref 70–99)

## 2013-12-26 LAB — VANCOMYCIN, TROUGH: Vancomycin Tr: 11.6 ug/mL (ref 10.0–20.0)

## 2013-12-26 MED ORDER — POTASSIUM CHLORIDE 20 MEQ/15ML (10%) PO LIQD
20.0000 meq | ORAL | Status: AC
  Administered 2013-12-26 (×2): 20 meq
  Filled 2013-12-26 (×2): qty 15

## 2013-12-26 MED ORDER — PRO-STAT SUGAR FREE PO LIQD
30.0000 mL | Freq: Every day | ORAL | Status: DC
  Administered 2013-12-26 – 2013-12-27 (×2): 30 mL
  Filled 2013-12-26 (×2): qty 30

## 2013-12-26 MED ORDER — VANCOMYCIN HCL 10 G IV SOLR
1250.0000 mg | Freq: Three times a day (TID) | INTRAVENOUS | Status: DC
  Filled 2013-12-26 (×2): qty 1250

## 2013-12-26 NOTE — Progress Notes (Signed)
NEURO HOSPITALIST PROGRESS NOTE   SUBJECTIVE:                                                                                                                        Remains intubated. Off sedatives for several days. No seizures, myoclonus, or other new neurological developments reported. MRI brain revealed profound and widespread hypoxic ischemic injury, with bilateral cortical and deep gray matter involvement. On keppra 1 gram IV bid.   OBJECTIVE:                                                                                                                           Vital signs in last 24 hours: Temp:  [97.6 F (36.4 C)-99.6 F (37.6 C)] 98.8 F (37.1 C) (06/17 1134) Pulse Rate:  [26-124] 92 (06/17 1200) Resp:  [15-30] 15 (06/17 1200) BP: (100-170)/(50-98) 170/97 mmHg (06/17 1200) SpO2:  [94 %-100 %] 100 % (06/17 1200) FiO2 (%):  [40 %] 40 % (06/17 1144) Weight:  [83.2 kg (183 lb 6.8 oz)] 83.2 kg (183 lb 6.8 oz) (06/17 0404)  Intake/Output from previous day: 06/16 0701 - 06/17 0700 In: 3020 [I.V.:220; NG/GT:1840; IV Piggyback:950] Out: 1450 [Urine:1450] Intake/Output this shift: Total I/O In: 675 [I.V.:50; NG/GT:475; IV Piggyback:150] Out: 425 [Urine:425] Nutritional status:    History reviewed. No pertinent past medical history.   Neurologic Exam:  Mental status: spontaneous eye opening and blinks but does not follow commands. CN 2-12: pupils 3-4 mm bilaterally, poorly reactive. EOM preserved. Corneal reflexes present bilaterally. Motor: spontaneous posturing Sensory: does not react to pain. DTR'S: 1+all over. Plantars: down Coordination and gait: unable to test.  Lab Results: No results found for this basename: cbc, bmp, coags, chol, tri, ldl, hga1c   Lipid Panel No results found for this basename: CHOL, TRIG, HDL, CHOLHDL, VLDL, LDLCALC,  in the last 72 hours  Studies/Results: Mr Laqueta JeanBrain W Wo Contrast  12/25/2013    CLINICAL DATA:  58 year old male status post cardiac arrest. Post anoxic encephalopathy suspected clinically. Initial encounter.  EXAM: MRI HEAD WITHOUT AND WITH CONTRAST  TECHNIQUE: Multiplanar, multiecho pulse sequences of the brain and surrounding structures were obtained without and with intravenous contrast.  CONTRAST:  18mL MULTIHANCE GADOBENATE DIMEGLUMINE 529 MG/ML IV  SOLN  COMPARISON:  Head CT without contrast 12/21/2013.  FINDINGS: Widespread abnormal signal in the brain. Widespread cortical restricted diffusion and T2 and FLAIR hyperintensity involving both hemispheres and the cerebellum.  Moderately to severely heterogeneous signal in the deep gray matter nuclei.  Mild mass effect on the ventricles throughout. Basilar cisterns remain patent. No midline shift. No acute intracranial hemorrhage identified. No extra-axial collection.  Following contrast, there is mild to moderate leptomeningeal enhancement, favor luxury perfusion related. Also there is mild parenchymal enhancement in the lentiform nuclei, substantia of the midbrain, and periaqueductal gray matter.  Major intracranial vascular flow voids are preserved. Negative pituitary, cervicomedullary junction and visualized cervical spine. Normal bone marrow signal.  Fluid in the pharynx. Right greater than left mastoid effusions. Scattered paranasal sinus mucosal thickening and trace fluid. Visualized orbit soft tissues are within normal limits.  IMPRESSION: Profound and widespread hypoxic ischemic injury, with bilateral cortical and deep gray matter involvement.   Electronically Signed   By: Augusto GambleLee  Hall M.D.   On: 12/25/2013 15:47   Dg Chest Port 1 View  12/26/2013   CLINICAL DATA:  Respiratory failure.  EXAM: PORTABLE CHEST - 1 VIEW  COMPARISON:  December 25, 2013.  FINDINGS: Stable cardiomegaly. The endotracheal tube is in grossly good position with distal tip 5 cm above the carina. Left internal jugular venous catheter is unchanged with distal tip in  expected position of the SVC. Nasogastric tube is unchanged. Slightly increased bilateral basilar opacities are noted most consistent with pulmonary edema with associated pleural effusions. No pneumothorax is noted.  IMPRESSION: Slightly increased bilateral basilar opacities are noted consistent with pulmonary edema with associated pleural effusions.   Electronically Signed   By: Roque LiasJames  Green M.D.   On: 12/26/2013 08:03   Dg Chest Port 1 View  12/25/2013   CLINICAL DATA:  Follow-up of respiratory failure  EXAM: PORTABLE CHEST - 1 VIEW  COMPARISON:  December 24, 2013 portable chest x-ray  FINDINGS: The lungs are adequately inflated. There is persistent infrahilar density on the right. No significant pleural effusion is demonstrated. The cardiac silhouette remains mildly enlarged, but the pulmonary vascularity is more distinct today. The endotracheal tube lies 5 cm above the crotch of the carina. The esophagogastric tube tip projects off the inferior margin of the image. The left internal jugular venous catheter tip lies in the proximal SVC.  IMPRESSION: There has been mild interval improvement in the appearance of the pulmonary interstitium. Persistent density in the right lower lobe is noted but is less conspicuous. The support tubes and lines are in appropriate position.   Electronically Signed   By: David  Swazil[place]   On: 12/25/2013 07:54    MEDICATIONS                                                                                                                       I have reviewed the patient's current medications.  ASSESSMENT/PLAN:  58 y/o s/p out of hospital cardiac arrest complicated by severe post anoxic-ischemic encephalopathy. Completed HT protocol 5 days ago. MRI brain showed widespread cortical-subcortical hypoxic-ischemic injury with bilateral cortical and deep gray matter involvement. Prior  EEG showed reactivity and neuro-exam demonstrates preserved corneal responses. However, he is already 5 days after completion of HT protocol, has marked cortical and subcortical gray matter damage on MRI,  and there is not evidence of purposeful neurological function on exam which makes me think that meaningful neurological recovery from this event will be extremely unlikely. Will continue to follow.    Wyatt Portela, MD [facility] [phone]  12/26/2013, 1:50 PM

## 2013-12-26 NOTE — Consult Note (Signed)
ANTIBIOTIC CONSULT NOTE - INITIAL  Pharmacy Consult for Vancomycin and Zosyn Indication: pneumonia  No Known Allergies  Patient Measurements: Height: 6\' 1"  (185.4 cm) Weight: 183 lb 6.8 oz (83.2 kg) IBW/kg (Calculated) : 79.9  Vital Signs: Temp: 98.8 F (37.1 C) (06/17 1134) Temp src: Oral (06/17 1134) BP: 170/97 mmHg (06/17 1200) Pulse Rate: 92 (06/17 1200) Intake/Output from previous day: 06/16 0701 - 06/17 0700 In: 3020 [I.V.:220; NG/GT:1840; IV Piggyback:950] Out: 1450 [Urine:1450] Intake/Output from this shift: Total I/O In: 675 [I.V.:50; NG/GT:475; IV Piggyback:150] Out: 425 [Urine:425]  Labs:  Recent Labs  12/24/13 0511 12/25/13 0500 12/26/13 0335  WBC 8.9 8.0 7.6  HGB 11.5* 10.6* 10.3*  PLT PLATELET CLUMPS NOTED ON SMEAR, COUNT APPEARS DECREASED 102* 114*  CREATININE 0.83 0.79 0.66   Estimated Creatinine Clearance: 116.5 ml/min (by C-G formula based on Cr of 0.66).   Microbiology: Recent Results (from the past 720 hour(s))  MRSA PCR SCREENING     Status: None   Collection Time    12/21/13  3:20 AM      Result Value Ref Range Status   MRSA by PCR NEGATIVE  NEGATIVE Final   Comment:            The GeneXpert MRSA Assay (FDA     approved for NASAL specimens     only), is one component of a     comprehensive MRSA colonization     surveillance program. It is not     intended to diagnose MRSA     infection nor to guide or     monitor treatment for     MRSA infections.  CULTURE, RESPIRATORY (NON-EXPECTORATED)     Status: None   Collection Time    12/24/13  1:00 PM      Result Value Ref Range Status   Specimen Description TRACHEAL ASPIRATE   Final   Special Requests NONE   Final   Gram Stain     Final   Value: RARE WBC PRESENT,BOTH PMN AND MONONUCLEAR     NO SQUAMOUS EPITHELIAL CELLS SEEN     RARE GRAM POSITIVE COCCI IN PAIRS     Performed at [facility]   Culture     Final   Value: Non-Pathogenic Oropharyngeal-type Flora Isolated.   Performed at [facility]   Report Status 12/26/2013 FINAL   Final    Medical History: History reviewed. No pertinent past medical history.  Assessment: 57yom admitted 6/12 s/p vfib arrest. He has been on empiric vancomycin and zosyn to cover for HCAP. Renal function has remained stable. Tm 100.5. WBC wnl. Culture neg so far. Trough came back at 11.6. Consider de-escalation if possible.  Goal of Therapy:  Vancomycin trough level 15-20 mcg/ml  Plan:   Change vanc to 1.25g IV q8 Cont zosyn 3.375g IV q8 F/u with another level if needed

## 2013-12-26 NOTE — Progress Notes (Addendum)
NUTRITION FOLLOW UP  DOCUMENTATION CODES Per approved criteria  -Not Applicable   INTERVENTION:  Continue Vital AF 1.2 at 65 ml/hr  Add Prostat liquid protein 30 ml daily via tube  Total TF regimen to provide 1972 kcals, 132 gm protein, 1265 ml of free water RD to follow for nutrition care plan  NUTRITION DIAGNOSIS: Inadequate oral intake related to inability to eat as evidenced by NPO status, ongoing  Goal: Pt to meet >/= 90% of their estimated nutrition needs, met  Monitor:  TF regimen & tolerance, respiratory status, weight, labs, I/O's  ASSESSMENT: Patient with unknown PMHx. Admitted s/p vfib arrest, found down in restroom at [facility], where pt is currently employed.   Code STEMI called 6/12, s/p left heart cath with coronary angiography.  S/p EEG 6/15 -- consistent with severe generalized nonspecific cerebral dysfunction.  Patient is currently intubated on ventilator support MV: 13.5 L/min Temp (24hrs), Avg:98.5 F (36.9 C), Min:97.6 F (36.4 C), Max:99.6 F (37.6 C)   Vital AF 1.2 formula currently infusing at goal rate of 65 ml/hr via OGT providing 1872 kcal, 117 gm protein, 1265 ml free water.  Free water flushes at 200 ml every 8 hours.  Height: Ht Readings from Last 1 Encounters:  12/21/13 $RemoveB'6\' 1"'TcioiXpc$  (1.854 m)    Weight: Wt Readings from Last 1 Encounters:  12/26/13 183 lb 6.8 oz (83.2 kg)    BMI:  Body mass index is 24.2 kg/(m^2).  Re-estimated needs: Kcal: 2100-2250 Protein: 125-135 gm Fluid: per MD  Skin: Intact  Diet Order: NPO   Intake/Output Summary (Last 24 hours) at 12/26/13 1028 Last data filed at 12/26/13 1000  Gross per 24 hour  Intake   3170 ml  Output   1625 ml  Net   1545 ml    Labs:   Recent Labs Lab 12/21/13 0430 12/21/13 0500  12/24/13 0511 12/25/13 0500 12/26/13 0335  NA 146 148*  < > 150* 147 146  K 4.0 3.1*  < > 2.9* 3.4* 3.4*  CL 107 114*  < > 115* 111 111  CO2 22 21  < > $R'24 23 24  'sR$ BUN 12 14  < > $R'19 16 15   'UG$ CREATININE 0.83 0.63  < > 0.83 0.79 0.66  CALCIUM 7.5* 7.0*  < > 7.6* 7.4* 7.4*  MG  --  1.6  --   --   --   --   PHOS  --  1.7*  --   --   --   --   GLUCOSE 241* 114*  < > 123* 120* 132*  < > = values in this interval not displayed.  CBG (last 3)   Recent Labs  12/25/13 1136 12/25/13 2330 12/26/13 0545  GLUCAP 135* 147* 148*    Scheduled Meds: . antiseptic oral rinse  15 mL Mouth Rinse QID  . aspirin  81 mg Per NG tube Daily  . chlorhexidine  15 mL Mouth Rinse BID  . free water  200 mL Per Tube 3 times per day  . heparin  5,000 Units Subcutaneous 3 times per day  . levETIRAcetam  1,000 mg Intravenous Q12H  . metoprolol tartrate  25 mg Per Tube TID  . pantoprazole sodium  40 mg Per Tube QHS  . piperacillin-tazobactam (ZOSYN)  IV  3.375 g Intravenous 3 times per day  . ticagrelor  90 mg Per NG tube BID  . vancomycin  1,000 mg Intravenous Q8H    Continuous Infusions: . sodium chloride 250  mL (12/25/13 0700)  . sodium chloride Stopped (12/22/13 1900)  . sodium chloride Stopped (12/22/13 1900)  . feeding supplement (VITAL AF 1.2 CAL) 1,000 mL (12/26/13 0027)    History reviewed. No pertinent past medical history.  History reviewed. No pertinent past surgical history.   Arthur Holms, RD, LDN Pager #: [phone] After-Hours Pager #: [phone]

## 2013-12-26 NOTE — Progress Notes (Signed)
PULMONARY / CRITICAL CARE MEDICINE   Name: Darryl MulchJohn Armacost MRN: [id number] DOB: 07/13/1955    ADMISSION DATE:  12/21/2013 CONSULTATION DATE:  12/21/2013  REFERRING MD :  EDP PRIMARY SERVICE: PCCM  CHIEF COMPLAINT:  V-Fib Arrest  BRIEF PATIENT DESCRIPTION: 58 y.o. M with unknown PMH, brought to ED after suffering Vfib arrest.  In field received 4 shocks + 3 while in ED.  PCCM was consulted for initiation of hypothermia protocol / admission.  SIGNIFICANT EVENTS / STUDIES:  6/12 - suffered out of hospital vfib arrest. 6/12 Head CT: NAICP 6/12 Echo: LVEF 30%. Severely dilated LV cavity. Diffuse hypokinesis worse in the septum apex and distal anterior wall 6/12 LHC: angioplasty and bare metal stent to RCA. Diffuse CAD. Severely impaired LV function 6/15 EEG: consistent with a severe generalized nonspecific cerebral dysfunction. The generalized periodic discharges recorded can be ictal, but the modification with noxious stimulation and lack of associated fast activity as well as slow frequency all argue for a non-ictal nature to these discharges. There was no definite seizure recorded. This EEG is reactive to outside stimuli. 6/16 MRI brain: Profound and widespread hypoxic ischemic injury, with bilateral cortical and deep gray matter involvement 6/17 Neuro re-eval in light of MRI results. Prognosis for meaningful recovery deemed to be very poor  LINES / TUBES: A-line  6/12 >> 6/13  ETT 6/12 >>  Left IJ CVL 6/12 >>    CULTURES: Resp 6/16 >> NOF  ANTIBIOTICS: Vanc 6/16 >> 6/17 Pip-tazo 6/16 >>   SUBJECTIVE:   Remains vegetative. Responds to pain. Possible posturing  VITAL SIGNS: Temp:  [97.6 F (36.4 C)-99.6 F (37.6 C)] 98.8 F (37.1 C) (06/17 1134) Pulse Rate:  [26-124] 92 (06/17 1200) Resp:  [15-30] 15 (06/17 1200) BP: (100-170)/(50-98) 170/97 mmHg (06/17 1200) SpO2:  [94 %-100 %] 100 % (06/17 1200) FiO2 (%):  [40 %] 40 % (06/17 1144) Weight:  [83.2 kg (183 lb 6.8 oz)] 83.2 kg  (183 lb 6.8 oz) (06/17 0404) HEMODYNAMICS:   VENTILATOR SETTINGS: Vent Mode:  [-] PRVC FiO2 (%):  [40 %] 40 % Set Rate:  [16 bmp] 16 bmp Vt Set:  [550 mL] 550 mL PEEP:  [5 cmH20] 5 cmH20 Plateau Pressure:  [0 cmH20-17 cmH20] 16 cmH20 INTAKE / OUTPUT: Intake/Output     06/16 0701 - 06/17 0700 06/17 0701 - 06/18 0700   I.V. (mL/kg) 220 (2.6) 70 (0.8)   Other 10    NG/GT 1840 805   IV Piggyback 950 150   Total Intake(mL/kg) 3020 (36.3) 1025 (12.3)   Urine (mL/kg/hr) 1450 (0.7) 560 (0.9)   Total Output 1450 560   Net +1570 +465        Stool Occurrence 3 x      PHYSICAL EXAMINATION: General: comatose Neuro: PERRL, EOMI, no spont movement HEENT: Zimm[place]/AT, sclerae anicteric. Cardiovascular: RRR, no M/R/G.  Lungs: B rhonchi Abdomen: BS x 4, soft, NT/ND.  Ext: no edema, warm    LABS: I have reviewed all of today's lab results. Relevant abnormalities are discussed in the A/P section  CXR: Slightly increased bilateral basilar opacities are noted consistent with pulmonary edema with associated pleural effusions   ASSESSMENT / PLAN: PULMONARY A: VDRF post arrest P:   Cont full vent support - settings reviewed and/or adjusted Cont vent bundle Daily SBT if/when meets criteria  CARDIOVASCULAR A:  Post VF arrest Diffuse CAD s/p RCA stent.   Coronary vasospasm, resolved PAF, resolved P:  Cards following Cont metoprolol Monitor rhythm and  BP  RENAL A:   AG metabolic acidosis, resolved P:   Monitor BMET intermittently Monitor I/Os Correct electrolytes as indicated  GASTROINTESTINAL A:   No issues P SUP: enteral PPI Cont TFs  HEMATOLOGIC A:  Mild anemia without overt blood loss P:  DVT px: SQ heparin Monitor CBC intermittently Transfuse per usual ICU guidelines  INFECTIOUS A:   Fever 6/15 Purulent bronchitis vs PNA - PCT minimally elevated 6/16 P:   Micro and abx as above  ENDOCRINE A:   Hyperglycemia without hx of DM, resolved P:   DC  SSI Monitor glu on chem panels Resume SSI for glu > 180  NEUROLOGIC A:   Severe post anoxic encephalopathy P:   Neuro following Minimize sedation If no improvement by end of week, consider W/D of support    I have personally obtained a history, examined the patient, evaluated laboratory and imaging results, formulated the assessment and plan and placed orders. CRITICAL CARE: The patient is critically ill with multiple organ systems failure and requires high complexity decision making for assessment and support, frequent evaluation and titration of therapies, application of advanced monitoring technologies and extensive interpretation of multiple databases. Critical Care Time devoted to patient care services described in this note is 35 minutes.   Billy Fischeravid Simonds, MD ; [facility] Mobile [phone].  After 5:30 PM or weekends, call [phone]  12/26/2013, 2:34 PM

## 2013-12-26 NOTE — Progress Notes (Signed)
[facility] FOR AM LAB REPLACEMENT ONLY  The patient does apply for the [facility] Adult ICU Electrolyte Replacment Protocol based on the criteria listed below:   1. Is GFR >/= 40 ml/min? yes  Patient's GFR today is >90 2. Is urine output >/= 0.5 ml/kg/hr for the last 6 hours? yes Patient's UOP is 1.2 ml/kg/hr 3. Is BUN < 60 mg/dL? yes  Patient's BUN today is 15 4. Abnormal electrolyte(s): K 3.4  5. Ordered repletion with: per protocol 6. If a panic level lab has been reported, has the CCM MD in charge been notified? no.   Physician:    Markus DaftWHELAN, GRETCHIN A 12/26/2013 6:31 AM

## 2013-12-26 NOTE — Progress Notes (Signed)
Subjective:  Intubated, not responsive except to noxious stimuli  Objective:  Temp:  [97.6 F (36.4 C)-99.6 F (37.6 C)] 99.6 F (37.6 C) (06/17 0730) Pulse Rate:  [26-124] 91 (06/17 0853) Resp:  [17-27] 24 (06/17 0853) BP: (100-136)/(50-98) 127/58 mmHg (06/17 0700) SpO2:  [94 %-100 %] 100 % (06/17 0853) FiO2 (%):  [40 %-70 %] 70 % (06/17 0853) Weight:  [183 lb 6.8 oz (83.2 kg)] 183 lb 6.8 oz (83.2 kg) (06/17 0404) Weight change: 3.5 oz (0.1 kg)  Intake/Output from previous day: 06/16 0701 - 06/17 0700 In: 3020 [I.V.:220; NG/GT:1840; IV Piggyback:950] Out: 1450 [Urine:1450]  Intake/Output from this shift: Total I/O In: 75 [I.V.:10; NG/GT:65] Out: 175 [Urine:175]  Physical Exam: General appearance: delirious, toxic and Intubated, not alert Neck: no adenopathy, no carotid bruit, no JVD, supple, symmetrical, trachea midline and thyroid not enlarged, symmetric, no tenderness/mass/nodules Lungs: clear to auscultation bilaterally Heart: regular rate and rhythm, S1, S2 normal, no murmur, click, rub or gallop Extremities: extremities normal, atraumatic, no cyanosis or edema and 2+ PP  Lab Results: Results for orders placed during the hospital encounter of 12/21/13 (from the past 48 hour(s))  PROCALCITONIN     Status: None   Collection Time    12/24/13 11:00 AM      Result Value Ref Range   Procalcitonin 0.71     Comment:            Interpretation:     PCT > 0.5 ng/mL and <= 2 ng/mL:     Systemic infection (sepsis) is possible,     but other conditions are known to elevate     PCT as well.     (NOTE)             ICU PCT Algorithm               Non ICU PCT Algorithm        ----------------------------     ------------------------------             PCT < 0.25 ng/mL                 PCT < 0.1 ng/mL         Stopping of antibiotics            Stopping of antibiotics           strongly encouraged.               strongly encouraged.        ----------------------------      ------------------------------           PCT level decrease by               PCT < 0.25 ng/mL           >= 80% from peak PCT           OR PCT 0.25 - 0.5 ng/mL          Stopping of antibiotics                                                 encouraged.         Stopping of antibiotics               encouraged.        ----------------------------     ------------------------------  PCT level decrease by              PCT >= 0.25 ng/mL           < 80% from peak PCT            AND PCT >= 0.5 ng/mL            Continuing antibiotics                                                  encouraged.           Continuing antibiotics                encouraged.        ----------------------------     ------------------------------         PCT level increase compared          PCT > 0.5 ng/mL             with peak PCT AND              PCT >= 0.5 ng/mL             Escalation of antibiotics                                              strongly encouraged.          Escalation of antibiotics            strongly encouraged.  GLUCOSE, CAPILLARY     Status: Abnormal   Collection Time    12/24/13 11:50 AM      Result Value Ref Range   Glucose-Capillary 123 (*) 70 - 99 mg/dL  GLUCOSE, CAPILLARY     Status: Abnormal   Collection Time    12/24/13 12:14 PM      Result Value Ref Range   Glucose-Capillary 122 (*) 70 - 99 mg/dL  CULTURE, RESPIRATORY (NON-EXPECTORATED)     Status: None   Collection Time    12/24/13  1:00 PM      Result Value Ref Range   Specimen Description TRACHEAL ASPIRATE     Special Requests NONE     Gram Stain       Value: RARE WBC PRESENT,BOTH PMN AND MONONUCLEAR     NO SQUAMOUS EPITHELIAL CELLS SEEN     RARE GRAM POSITIVE COCCI IN PAIRS     Performed at [facility]   Culture       Value: Non-Pathogenic Oropharyngeal-type Flora Isolated.     Performed at [facility]   Report Status 12/26/2013 FINAL    GLUCOSE, CAPILLARY     Status: Abnormal   Collection  Time    12/24/13  3:59 PM      Result Value Ref Range   Glucose-Capillary 125 (*) 70 - 99 mg/dL  GLUCOSE, CAPILLARY     Status: Abnormal   Collection Time    12/24/13  8:07 PM      Result Value Ref Range   Glucose-Capillary 117 (*) 70 - 99 mg/dL  GLUCOSE, CAPILLARY     Status: Abnormal   Collection Time    12/24/13 11:23 PM      Result Value Ref  Range   Glucose-Capillary 137 (*) 70 - 99 mg/dL  GLUCOSE, CAPILLARY     Status: Abnormal   Collection Time    12/25/13  4:31 AM      Result Value Ref Range   Glucose-Capillary 112 (*) 70 - 99 mg/dL  CBC     Status: Abnormal   Collection Time    12/25/13  5:00 AM      Result Value Ref Range   WBC 8.0  4.0 - 10.5 K/uL   RBC 3.17 (*) 4.22 - 5.81 MIL/uL   Hemoglobin 10.6 (*) 13.0 - 17.0 g/dL   HCT 32.0 (*) 39.0 - 52.0 %   MCV 100.9 (*) 78.0 - 100.0 fL   MCH 33.4  26.0 - 34.0 pg   MCHC 33.1  30.0 - 36.0 g/dL   RDW 13.1  11.5 - 15.5 %   Platelets 102 (*) 150 - 400 K/uL   Comment: SPECIMEN CHECKED FOR CLOTS     REPEATED TO VERIFY     PLATELET COUNT CONFIRMED BY SMEAR  COMPREHENSIVE METABOLIC PANEL     Status: Abnormal   Collection Time    12/25/13  5:00 AM      Result Value Ref Range   Sodium 147  137 - 147 mEq/L   Potassium 3.4 (*) 3.7 - 5.3 mEq/L   Chloride 111  96 - 112 mEq/L   CO2 23  19 - 32 mEq/L   Glucose, Bld 120 (*) 70 - 99 mg/dL   BUN 16  6 - 23 mg/dL   Creatinine, Ser 0.79  0.50 - 1.35 mg/dL   Calcium 7.4 (*) 8.4 - 10.5 mg/dL   Total Protein 5.1 (*) 6.0 - 8.3 g/dL   Albumin 2.4 (*) 3.5 - 5.2 g/dL   AST 93 (*) 0 - 37 U/L   ALT 55 (*) 0 - 53 U/L   Alkaline Phosphatase 32 (*) 39 - 117 U/L   Total Bilirubin 0.6  0.3 - 1.2 mg/dL   GFR calc non Af Amer >90  >90 mL/min   GFR calc Af Amer >90  >90 mL/min   Comment: (NOTE)     The eGFR has been calculated using the CKD EPI equation.     This calculation has not been validated in all clinical situations.     eGFR's persistently <90 mL/min signify possible Chronic Kidney      Disease.  PROCALCITONIN     Status: None   Collection Time    12/25/13  5:00 AM      Result Value Ref Range   Procalcitonin 0.42     Comment:            Interpretation:     PCT (Procalcitonin) <= 0.5 ng/mL:     Systemic infection (sepsis) is not likely.     Local bacterial infection is possible.     (NOTE)             ICU PCT Algorithm               Non ICU PCT Algorithm        ----------------------------     ------------------------------             PCT < 0.25 ng/mL                 PCT < 0.1 ng/mL         Stopping of antibiotics            Stopping of  antibiotics           strongly encouraged.               strongly encouraged.        ----------------------------     ------------------------------           PCT level decrease by               PCT < 0.25 ng/mL           >= 80% from peak PCT           OR PCT 0.25 - 0.5 ng/mL          Stopping of antibiotics                                                 encouraged.         Stopping of antibiotics               encouraged.        ----------------------------     ------------------------------           PCT level decrease by              PCT >= 0.25 ng/mL           < 80% from peak PCT            AND PCT >= 0.5 ng/mL            Continuing antibiotics                                                  encouraged.           Continuing antibiotics                encouraged.        ----------------------------     ------------------------------         PCT level increase compared          PCT > 0.5 ng/mL             with peak PCT AND              PCT >= 0.5 ng/mL             Escalation of antibiotics                                              strongly encouraged.          Escalation of antibiotics            strongly encouraged.  GLUCOSE, CAPILLARY     Status: Abnormal   Collection Time    12/25/13  7:43 AM      Result Value Ref Range   Glucose-Capillary 120 (*) 70 - 99 mg/dL  GLUCOSE, CAPILLARY     Status: Abnormal    Collection Time    12/25/13 11:31 AM      Result Value Ref Range   Glucose-Capillary 125 (*) 70 - 99 mg/dL  GLUCOSE, CAPILLARY     Status:  Abnormal   Collection Time    12/25/13 11:36 AM      Result Value Ref Range   Glucose-Capillary 135 (*) 70 - 99 mg/dL  GLUCOSE, CAPILLARY     Status: Abnormal   Collection Time    12/25/13 11:30 PM      Result Value Ref Range   Glucose-Capillary 147 (*) 70 - 99 mg/dL  PROCALCITONIN     Status: None   Collection Time    12/26/13  3:35 AM      Result Value Ref Range   Procalcitonin 0.25     Comment:            Interpretation:     PCT (Procalcitonin) <= 0.5 ng/mL:     Systemic infection (sepsis) is not likely.     Local bacterial infection is possible.     (NOTE)             ICU PCT Algorithm               Non ICU PCT Algorithm        ----------------------------     ------------------------------             PCT < 0.25 ng/mL                 PCT < 0.1 ng/mL         Stopping of antibiotics            Stopping of antibiotics           strongly encouraged.               strongly encouraged.        ----------------------------     ------------------------------           PCT level decrease by               PCT < 0.25 ng/mL           >= 80% from peak PCT           OR PCT 0.25 - 0.5 ng/mL          Stopping of antibiotics                                                 encouraged.         Stopping of antibiotics               encouraged.        ----------------------------     ------------------------------           PCT level decrease by              PCT >= 0.25 ng/mL           < 80% from peak PCT            AND PCT >= 0.5 ng/mL            Continuing antibiotics                                                  encouraged.           Continuing antibiotics  encouraged.        ----------------------------     ------------------------------         PCT level increase compared          PCT > 0.5 ng/mL             with peak PCT AND               PCT >= 0.5 ng/mL             Escalation of antibiotics                                              strongly encouraged.          Escalation of antibiotics            strongly encouraged.  BASIC METABOLIC PANEL     Status: Abnormal   Collection Time    12/26/13  3:35 AM      Result Value Ref Range   Sodium 146  137 - 147 mEq/L   Potassium 3.4 (*) 3.7 - 5.3 mEq/L   Chloride 111  96 - 112 mEq/L   CO2 24  19 - 32 mEq/L   Glucose, Bld 132 (*) 70 - 99 mg/dL   BUN 15  6 - 23 mg/dL   Creatinine, Ser 0.66  0.50 - 1.35 mg/dL   Calcium 7.4 (*) 8.4 - 10.5 mg/dL   GFR calc non Af Amer >90  >90 mL/min   GFR calc Af Amer >90  >90 mL/min   Comment: (NOTE)     The eGFR has been calculated using the CKD EPI equation.     This calculation has not been validated in all clinical situations.     eGFR's persistently <90 mL/min signify possible Chronic Kidney     Disease.  CBC     Status: Abnormal   Collection Time    12/26/13  3:35 AM      Result Value Ref Range   WBC 7.6  4.0 - 10.5 K/uL   RBC 3.12 (*) 4.22 - 5.81 MIL/uL   Hemoglobin 10.3 (*) 13.0 - 17.0 g/dL   HCT 31.7 (*) 39.0 - 52.0 %   MCV 101.6 (*) 78.0 - 100.0 fL   MCH 33.0  26.0 - 34.0 pg   MCHC 32.5  30.0 - 36.0 g/dL   RDW 13.5  11.5 - 15.5 %   Platelets 114 (*) 150 - 400 K/uL   Comment: CONSISTENT WITH PREVIOUS RESULT  GLUCOSE, CAPILLARY     Status: Abnormal   Collection Time    12/26/13  5:45 AM      Result Value Ref Range   Glucose-Capillary 148 (*) 70 - 99 mg/dL    Imaging: Imaging results have been reviewed  Assessment/Plan:   1. Principal Problem: 2.   Cardiac arrest 3. Active Problems: 4.   Ventricular fibrillation 5.   Acute respiratory failure with hypoxia 6.   Encephalopathy acute 7.   ST elevation myocardial infarction (STEMI) of inferior wall 8.   CAD (coronary artery disease), native coronary artery 9.   DNR (do not resuscitate) 10.   Time Spent Directly with Patient:  20 minutes  Length of  Stay:  LOS: 5 days   OOH VF arrest. Severe LV dysfunction, RCA intervention , [employer]. Pt has remained hemodynamically stable.NSR. On no drips. Major  issue is neuro recovery which looks uncertain. I have discussed with PCCM (Simonds) and Neuro. Will S/O but be avail for any issues that arise.  Lorretta Harp 12/26/2013, 9:34 AM

## 2013-12-27 ENCOUNTER — Inpatient Hospital Stay (HOSPITAL_COMMUNITY): Payer: Medicaid Other

## 2013-12-27 LAB — BASIC METABOLIC PANEL
BUN: 16 mg/dL (ref 6–23)
CHLORIDE: 112 meq/L (ref 96–112)
CO2: 24 meq/L (ref 19–32)
Calcium: 7.9 mg/dL — ABNORMAL LOW (ref 8.4–10.5)
Creatinine, Ser: 0.7 mg/dL (ref 0.50–1.35)
GFR calc Af Amer: >90 mL/min (ref >90)
GLUCOSE: 130 mg/dL — AB (ref 70–99)
POTASSIUM: 3.9 meq/L (ref 3.7–5.3)
SODIUM: 147 meq/L (ref 137–147)

## 2013-12-27 LAB — CBC
HEMATOCRIT: 32.3 % — AB (ref 39.0–52.0)
Hemoglobin: 10.5 g/dL — ABNORMAL LOW (ref 13.0–17.0)
MCH: 33.2 pg (ref 26.0–34.0)
MCHC: 32.5 g/dL (ref 30.0–36.0)
MCV: 102.2 fL — ABNORMAL HIGH (ref 78.0–100.0)
Platelets: 114 10*3/uL — ABNORMAL LOW (ref 150–400)
RBC: 3.16 MIL/uL — AB (ref 4.22–5.81)
RDW: 13.3 % (ref 11.5–15.5)
WBC: 7.8 10*3/uL (ref 4.0–10.5)

## 2013-12-27 LAB — GLUCOSE, CAPILLARY
GLUCOSE-CAPILLARY: 137 mg/dL — AB (ref 70–99)
Glucose-Capillary: 120 mg/dL — ABNORMAL HIGH (ref 70–99)

## 2013-12-27 MED ORDER — MORPHINE SULFATE 2 MG/ML IJ SOLN
2.0000 mg | INTRAMUSCULAR | Status: DC | PRN
  Administered 2013-12-27: 8 mg via INTRAVENOUS
  Filled 2013-12-27: qty 5

## 2013-12-27 MED ORDER — SCOPOLAMINE 1 MG/3DAYS TD PT72
1.0000 | MEDICATED_PATCH | TRANSDERMAL | Status: DC
  Administered 2013-12-27 – 2013-12-30 (×2): 1.5 mg via TRANSDERMAL
  Filled 2013-12-27 (×2): qty 1

## 2013-12-27 MED ORDER — CHLORHEXIDINE GLUCONATE 0.12 % MT SOLN
15.0000 mL | Freq: Two times a day (BID) | OROMUCOSAL | Status: DC
  Administered 2013-12-27: 15 mL via OROMUCOSAL
  Filled 2013-12-27: qty 15

## 2013-12-27 MED ORDER — LORAZEPAM 2 MG/ML IJ SOLN: 2.0000 mg | INTRAMUSCULAR | Status: DC | PRN

## 2013-12-27 MED ORDER — MORPHINE SULFATE 2 MG/ML IJ SOLN
2.0000 mg | INTRAMUSCULAR | Status: DC | PRN
  Administered 2013-12-27: 2 mg via INTRAVENOUS
  Administered 2013-12-27: 4 mg via INTRAVENOUS
  Administered 2013-12-28 (×2): 2 mg via INTRAVENOUS
  Administered 2013-12-29 (×2): 4 mg via INTRAVENOUS
  Administered 2013-12-29 (×2): 2 mg via INTRAVENOUS
  Administered 2013-12-29 – 2013-12-30 (×6): 4 mg via INTRAVENOUS
  Administered 2013-12-30 – 2013-12-31 (×2): 6 mg via INTRAVENOUS
  Administered 2013-12-31: 4 mg via INTRAVENOUS
  Filled 2013-12-27: qty 2
  Filled 2013-12-27: qty 1
  Filled 2013-12-27: qty 3
  Filled 2013-12-27: qty 1
  Filled 2013-12-27 (×7): qty 2
  Filled 2013-12-27: qty 1
  Filled 2013-12-27: qty 2
  Filled 2013-12-27: qty 3
  Filled 2013-12-27: qty 1
  Filled 2013-12-27: qty 2
  Filled 2013-12-27: qty 1

## 2013-12-27 MED ORDER — BIOTENE DRY MOUTH MT LIQD
15.0000 mL | Freq: Two times a day (BID) | OROMUCOSAL | Status: DC
  Administered 2013-12-27: 15 mL via OROMUCOSAL

## 2013-12-27 MED ORDER — MORPHINE SULFATE 4 MG/ML IJ SOLN
INTRAMUSCULAR | Status: AC
  Filled 2013-12-27: qty 2

## 2013-12-27 NOTE — Progress Notes (Addendum)
Put funeral resource list on pt's chart for pt's friends in the event they would like to have a service for pt. This resource list also has information about discounted services through DSS. In the event pt's body is not claimed by friends/family, 30 days after death the county will cremate and bury his body. RN welcome to call CSW if questions arise from friends or if friends would like support.   Maryclare LabradorJulie Hilda Rynders, MSW, [facility] Clinical Social Worker [phone]

## 2013-12-27 NOTE — Progress Notes (Signed)
Patient terminally extubated per MD. Positive for cuff leak prior to extubation. Patient has moderate secretions in back of throat. Unable to cough. RN and Friend at bedside post extubation.

## 2013-12-27 NOTE — Progress Notes (Addendum)
CSW provided support to pt's friend, who was at bedside during extubation. Pt's friend had to leave due to family emergency, but thanked CSW for [employer]resource list of funeral homes and DSS programs that provide funding for funerals. CSW provided work cell to pt's friend, inviting him to call with any questions or if he needs assistance. CSW informed pt's friend that hospital policy according to security is to hold a patient's body for 30 days before the state seizes a body. Pt's friend relieved to hear he has some time to collaborate with other members of his congregation and pt's coworkers to plan a memorial.  Addendum: CSW added pt's friends' phone numbers to the chart. Bodies are held in the morgue for 3 days and then hospital staff attempts to reach pt's family and friends for decision-making. CSW called pt's friend Darryl Mays to update him that the hospital holds bodies for 3 days, not 30.  Darryl Mays, MSW, [facility] Clinical Social Worker [phone]

## 2013-12-27 NOTE — Progress Notes (Signed)
Pt arrived to unit brought by nurse tech Nyra MarketShayla Simpson. Report called and given by Larina BrasEmilee Smith. Patient is comfort care.

## 2013-12-27 NOTE — Progress Notes (Signed)
Report called to receiving RN, Delice Bisonara.  All questions answered.  Patient's chart, medications, and personal belongings all sent with patient.  Patient is comfort care status.  Keitha ButteSMITH, EMILEE A, RN

## 2013-12-27 NOTE — Progress Notes (Signed)
PULMONARY / CRITICAL CARE MEDICINE   Name: Darryl Mays MRN: [id number] DOB: 06/02/1956    ADMISSION DATE:  12/21/2013 CONSULTATION DATE:  12/21/2013  REFERRING MD :  EDP PRIMARY SERVICE: PCCM  CHIEF COMPLAINT:  V-Fib Arrest  BRIEF PATIENT DESCRIPTION: 58 y.o. M with unknown PMH, brought to ED after suffering Vfib arrest.  In field received 4 shocks + 3 while in ED.  PCCM was consulted for initiation of hypothermia protocol / admission.  SIGNIFICANT EVENTS / STUDIES:  6/12 - suffered out of hospital vfib arrest. 6/12 Head CT: NAICP 6/12 Echo: LVEF 30%. Severely dilated LV cavity. Diffuse hypokinesis worse in the septum apex and distal anterior wall 6/12 LHC: angioplasty and bare metal stent to RCA. Diffuse CAD. Severely impaired LV function 6/15 EEG: consistent with a severe generalized nonspecific cerebral dysfunction. The generalized periodic discharges recorded can be ictal, but the modification with noxious stimulation and lack of associated fast activity as well as slow frequency all argue for a non-ictal nature to these discharges. There was no definite seizure recorded. This EEG is reactive to outside stimuli. 6/16 MRI brain: Profound and widespread hypoxic ischemic injury, with bilateral cortical and deep gray matter involvement 6/17 Neuro re-eval in light of MRI results. Prognosis for meaningful recovery deemed to be very poor  LINES / TUBES: A-line  6/12 >> 6/13  ETT 6/12 >>  Left IJ CVL 6/12 >>    CULTURES: Resp 6/16 >> NOF  ANTIBIOTICS: Vanc 6/16 >> 6/17 Pip-tazo 6/16 >>   SUBJECTIVE:   Remains vegetative. Possible posturing. Facial twitching - possible seizure  VITAL SIGNS: Temp:  [97.8 F (36.6 C)-99.1 F (37.3 C)] 98.5 F (36.9 C) (06/18 1203) Pulse Rate:  [39-110] 71 (06/18 1300) Resp:  [17-33] 17 (06/18 1300) BP: (92-162)/(33-81) 162/58 mmHg (06/18 1203) SpO2:  [93 %-100 %] 93 % (06/18 1300) FiO2 (%):  [40 %] 40 % (06/18 1200) Weight:  [82.7 kg (182 lb  5.1 oz)] 82.7 kg (182 lb 5.1 oz) (06/18 0600) HEMODYNAMICS:   VENTILATOR SETTINGS: Vent Mode:  [-] PRVC FiO2 (%):  [40 %] 40 % Set Rate:  [16 bmp] 16 bmp Vt Set:  [500 mL-550 mL] 550 mL PEEP:  [5 cmH20] 5 cmH20 Plateau Pressure:  [16 cmH20] 16 cmH20 INTAKE / OUTPUT: Intake/Output     06/17 0701 - 06/18 0700 06/18 0701 - 06/19 0700   I.V. (mL/kg) 110 (1.3)    Other 3    NG/GT 2305 190   IV Piggyback 350 110   Total Intake(mL/kg) 2768 (33.5) 300 (3.6)   Urine (mL/kg/hr) 1470 (0.7) 310 (0.5)   Total Output 1470 310   Net +1298 -10          PHYSICAL EXAMINATION: General: comatose Neuro: PERRL, EOMI, no spont movement HEENT: [place]/AT, sclerae anicteric. Cardiovascular: RRR, no M/R/G.  Lungs: B rhonchi Abdomen: BS x 4, soft, NT/ND.  Ext: no edema, warm    LABS: I have reviewed all of today's lab results. Relevant abnormalities are discussed in the A/P section  CXR: NSC   ASSESSMENT / PLAN: Post VF arrest Diffuse CAD s/p RCA stent.   Coronary vasospasm, resolved PAF, resolved VDRF post arrest AG metabolic acidosis, resolved Mild anemia without overt blood loss Fever, resolved Probable PNA Hyperglycemia without hx of DM, resolved Severe post anoxic encephalopathy  Patient's pastor friend notified of plan for extubation and comfort care. Withdrawal protocol implemented. Transfer to med-surg bed for palliative care needs.    Billy Fischeravid Simonds, MD ; PCCM  service Mobile [phone].  After 5:30 PM or weekends, call [phone]  12/27/2013, 2:04 PM

## 2013-12-28 NOTE — Progress Notes (Signed)
Nutrition Brief Note  Chart reviewed. Pt now transitioning to comfort care.  No further nutrition interventions warranted at this time.  Please re-consult as needed.   Brynden Thune MS, RD, LDN Inpatient Registered Dietitian Pager: [phone] After-hours pager: [phone]    

## 2013-12-28 NOTE — Progress Notes (Signed)
PULMONARY / CRITICAL CARE MEDICINE   Name: Darryl MulchJohn Mays MRN: [id number] DOB: 09/17/1955    ADMISSION DATE:  12/21/2013  REFERRING MD :  EDP  CHIEF COMPLAINT:  V-Fib Arrest  HOSPITAL COURSE:  58 yo male with VF arrest.  He required defibrillation several times as outpt and also in ED.  He was intubated.  Cardiology consulted.  Initial head CT was negative.  He was started on hypothermia protocol.  Echo showed acute systolic heart failure.  He had angioplasty with insertion of bare metal stent to RCA.  After rewarming he did not regain mental status.  EEG showed generalized periodic discharges.  Neurology was consulted.  He was started on keppra.  He developed aspiration pneumonia and was started on antibiotics.  MRI of brain showed showed widespread cortical-subcortical hypoxic-ischemic injury with bilateral cortical and deep gray matter involvement.  He was made DNR, and transitioned to comfort measures only.  He was extubated, and then transferred to medical floor bed.  SUBJECTIVE:   Remains vegetative. Possible posturing. Facial twitching - possible seizure  VITAL SIGNS: Temp:  [98.5 F (36.9 C)-98.6 F (37 C)] 98.6 F (37 C) (06/19 0622) Pulse Rate:  [63-109] 78 (06/19 0622) Resp:  [17-30] 22 (06/19 0622) BP: (107-162)/(33-78) 121/78 mmHg (06/19 0622) SpO2:  [92 %-100 %] 93 % (06/19 0622) FiO2 (%):  [40 %] 40 % (06/18 1200) Weight:  [85.594 kg (188 lb 11.2 oz)] 85.594 kg (188 lb 11.2 oz) (06/18 1600)  PHYSICAL EXAMINATION: General: comatose Neuro: PERRL, EOMI, no spont movement HEENT: /AT, sclerae anicteric. Cardiovascular: RRR, no M/R/G.  Lungs: B rhonchi, stertorous respirations  Abdomen: BS x 4, soft, NT/ND.  Ext: no edema, warm  ASSESSMENT: VF arrest Acute respiratory failure with hypoxia Respiratory acidosis Aspiration pneumonia Acute systolic heart failure CAD s/p PCI with stent to RCA Coronary vasospasm Paroxysmal atrial fibrillation Metabolic  acidosis Hyperglycemia Anoxic encephalopathy Coma Seizures Thrombocytopenia Hypocalcemia Hypernatremia Hypokalemia Anemia of critical illness DNR/DNI Comfort care  PLAN: PRN morphine, ativan Scopolamine patch  Brett CanalesSteve Minor ACNP [facility] PCCM Pager [phone] till 3 pm If no answer page [phone] 12/28/2013, 8:35 AM  Coralyn HellingVineet Sood, MD [facility]Critical [facility] 12/28/2013, 11:06 AM Pager:  [phone] After 3pm call: [phone]

## 2013-12-29 DIAGNOSIS — Z66 Do not resuscitate: Secondary | ICD-10-CM

## 2013-12-29 NOTE — Progress Notes (Signed)
PULMONARY / CRITICAL CARE MEDICINE   Name: Darryl Mays MRN: [id number] DOB: 02/09/1956    ADMISSION DATE:  12/21/2013  REFERRING MD :  EDP  CHIEF COMPLAINT:  V-Fib Arrest  HOSPITAL COURSE:  58 yo male with VF arrest.  He required defibrillation several times as outpt and also in ED.  He was intubated.  Cardiology consulted.  Initial head CT was negative.  He was started on hypothermia protocol.  Echo showed acute systolic heart failure.  He had angioplasty with insertion of bare metal stent to RCA.  After rewarming he did not regain mental status.  EEG showed generalized periodic discharges.  Neurology was consulted.  He was started on keppra.  He developed aspiration pneumonia and was started on antibiotics.  MRI of brain showed showed widespread cortical-subcortical hypoxic-ischemic injury with bilateral cortical and deep gray matter involvement.  He was made DNR, and transitioned to comfort measures only.  He was extubated, and then transferred to medical floor bed.  SUBJECTIVE:   Remains vegetative. Appears comfortable, mild dysynchronous breathing.  VITAL SIGNS: Temp:  [99 F (37.2 C)] 99 F (37.2 C) (06/20 0505) Pulse Rate:  [81] 81 (06/20 0505) Resp:  [21] 21 (06/20 0505) BP: (136)/(62) 136/62 mmHg (06/20 0505) SpO2:  [88 %] 88 % (06/20 0505)  PHYSICAL EXAMINATION: General: comatose, unresponsive Neuro: no response to stimuli, does not move extremities HEENT: nose without purulence, no LN or TMG Cardiovascular: RRR, no M/R/G.  Lungs: decreased bs throughout Abdomen: BS x 4, soft, NT/ND.  Ext: no edema, warm  ASSESSMENT: VF arrest Acute respiratory failure with hypoxia Respiratory acidosis Aspiration pneumonia Acute systolic heart failure CAD s/p PCI with stent to RCA Coronary vasospasm Paroxysmal atrial fibrillation Metabolic acidosis Hyperglycemia Anoxic encephalopathy Coma Seizures Thrombocytopenia Hypocalcemia Hypernatremia Hypokalemia Anemia of critical  illness DNR/DNI Comfort care  PLAN: Continue comfort measures.

## 2013-12-30 NOTE — Progress Notes (Signed)
PULMONARY / CRITICAL CARE MEDICINE   Name: Darryl Mays MRN: [id number] DOB: 03/16/1956    ADMISSION DATE:  12/21/2013  REFERRING MD :  EDP  CHIEF COMPLAINT:  V-Fib Arrest  HOSPITAL COURSE:  58 yo male with VF arrest.  He required defibrillation several times as outpt and also in ED.  He was intubated.  Cardiology consulted.  Initial head CT was negative.  He was started on hypothermia protocol.  Echo showed acute systolic heart failure.  He had angioplasty with insertion of bare metal stent to RCA.  After rewarming he did not regain mental status.  EEG showed generalized periodic discharges.  Neurology was consulted.  He was started on keppra.  He developed aspiration pneumonia and was started on antibiotics.  MRI of brain showed showed widespread cortical-subcortical hypoxic-ischemic injury with bilateral cortical and deep gray matter involvement.  He was made DNR, and transitioned to comfort measures only.  He was extubated, and then transferred to medical floor bed.  SUBJECTIVE:   Remains vegetative. Having periods of apnea during my exam. Does not appear uncomfortable.  VITAL SIGNS: Temp:  [98.5 F (36.9 C)] 98.5 F (36.9 C) (06/21 0700) Pulse Rate:  [75] 75 (06/21 0700) Resp:  [9] 9 (06/21 0700) BP: (143)/(62) 143/62 mmHg (06/21 0700) SpO2:  [94 %] 94 % (06/21 0700)  PHYSICAL EXAMINATION: General: comatose, unresponsive Neuro: no response to stimuli, does not move extremities HEENT: nose without purulence, no LN or TMG Cardiovascular: RRR Lungs: decreased bs throughout, +loud upper airway noise Abdomen: BS x 4, soft, NT/ND.  Ext: no edema  ASSESSMENT: VF arrest Acute respiratory failure with hypoxia Respiratory acidosis Aspiration pneumonia Acute systolic heart failure CAD s/p PCI with stent to RCA Coronary vasospasm Paroxysmal atrial fibrillation Metabolic acidosis Hyperglycemia Anoxic  encephalopathy Coma Seizures Thrombocytopenia Hypocalcemia Hypernatremia Hypokalemia Anemia of critical illness DNR/DNI Comfort care  PLAN: Continue comfort measures.

## 2013-12-31 DIAGNOSIS — Z515 Encounter for palliative care: Secondary | ICD-10-CM

## 2013-12-31 MED ORDER — SODIUM CHLORIDE 0.9 % IV SOLN
3.0000 mg/h | INTRAVENOUS | Status: DC
Start: 2013-12-31 — End: 2014-01-01
  Administered 2013-12-31: 3 mg/h via INTRAVENOUS
  Filled 2013-12-31 (×2): qty 10

## 2013-12-31 NOTE — Progress Notes (Signed)
CSW spoke with the Pt's emergency contact (landlord)  [name] (cell: [phone]) for a request to refer Pt to a residential hospice facility. Pt's Landlord stated that he does not know if the Pt has any next of kin and he has been trying to locate the Pt's family to assist with making decisions. Pt's Landlord is unsure if he would feel comfortable making these kind of decisions. Mr. Darryl Mays was able to locate a possible sister Darryl Route([name] [address] [place] [id number]) and provided the [place] [employer]'s number [phone] to assist in locating this individual.    CSW provided a call back number and is awaiting more information as to whether this individual is Pt's sister.   [facility] and update Darryl Mays on Pt familial status.    CSW will continue to follow Pt for d/c planning.    Darryl Mays [facility] CenterCSWA  [facility]  4N 1-16;  [phone] Phone: [phone]

## 2013-12-31 NOTE — Progress Notes (Signed)
CSW continues to provide emotional support to pt's friend. Following case.   Darryl LabradorJulie Anderson, MSW, [facility] Detroit[facility] Clinical Social Worker [phone]

## 2013-12-31 NOTE — Progress Notes (Signed)
   CSW spoke with Claude MangesMark Hunnemann [phone] and he is willing to speak with Forrestine HimEva Davis concerning Residential Hospice Placement. Loraine LericheMark stated that he was close to the Pt and would like to "help in any way he could'.   CSW did receive a call back from [place] [facility] [place] [employer] dept stating that the "possible sister" could not be reached until tomorrow. The dept will contact CSW tomorrow with the information.   CSW will continue to follow Pt for d/c planning.    Leron Croakassandra Tamira R[facility]   Hospital  4N 1-16;  [phone] Phone: [phone]

## 2013-12-31 NOTE — Progress Notes (Signed)
PULMONARY / CRITICAL CARE MEDICINE   Name: Darryl Mays MRN: [id number] DOB: 02/18/1956    ADMISSION DATE:  12/21/2013  REFERRING MD :  EDP  CHIEF COMPLAINT:  V-Fib Arrest  HOSPITAL COURSE:  58 yo male with VF arrest.  He required defibrillation several times as outpt and also in ED.  He was intubated.  Cardiology consulted.  Initial head CT was negative.  He was started on hypothermia protocol.  Echo showed acute systolic heart failure.  He had angioplasty with insertion of bare metal stent to RCA.  After rewarming he did not regain mental status.  EEG showed generalized periodic discharges.  Neurology was consulted.  He was started on keppra.  He developed aspiration pneumonia and was started on antibiotics.  MRI of brain showed showed widespread cortical-subcortical hypoxic-ischemic injury with bilateral cortical and deep gray matter involvement.  He was made DNR, and transitioned to comfort measures only.  He was extubated, and then transferred to medical floor bed.  SUBJECTIVE:   Remains vegetative. Mildly tachypneic but otherwise appears comfortable.  Has no family.   VITAL SIGNS: Temp:  [99.1 F (37.3 C)] 99.1 F (37.3 C) (06/22 0604) Pulse Rate:  [96] 96 (06/22 0604) Resp:  [14] 14 (06/22 0604) BP: (122)/(74) 122/74 mmHg (06/22 0604) SpO2:  [90 %] 90 % (06/22 0604)  PHYSICAL EXAMINATION: General: comatose, unresponsive Neuro: no response to stimuli, does not move extremities HEENT: nose without purulence, no LN or TMG Cardiovascular: RRR Lungs: decreased bs throughout, +loud upper airway noise, tachypneic  Abdomen: BS x 4, soft, NT/ND.  Ext: no edema  ASSESSMENT: VF arrest Acute respiratory failure with hypoxia Respiratory acidosis Aspiration pneumonia Acute systolic heart failure CAD s/p PCI with stent to RCA Coronary vasospasm Paroxysmal atrial fibrillation Metabolic acidosis Hyperglycemia Anoxic  encephalopathy Coma Seizures Thrombocytopenia Hypocalcemia Hypernatremia Hypokalemia Anemia of critical illness DNR/DNI Comfort care  PLAN: Continue comfort measures. Change to morphine gtt - seems to be getting ~6mg  q2-3 hours and remains tachypneic  CM following - [facility]   Dirk DressKaty Whiteheart, NP 12/31/2013  10:05 AM Pager: [phone] or [phone]  I have personally obtained history, examined patient, evaluated and interpreted laboratory and imaging results, reviewed medical records, formulated assessment / plan and placed orders.  Lonia FarberZUBELEVITSKIY, KONSTANTIN, MD Pulmonary and Critical Care Medicine [facility] Pager: [phone]  12/31/2013, 12:57 PM

## 2014-01-01 DIAGNOSIS — J9600 Acute respiratory failure, unspecified whether with hypoxia or hypercapnia: Secondary | ICD-10-CM

## 2014-01-01 DIAGNOSIS — I469 Cardiac arrest, cause unspecified: Secondary | ICD-10-CM

## 2014-01-01 MED ORDER — SCOPOLAMINE 1 MG/3DAYS TD PT72: 1.0000 | MEDICATED_PATCH | TRANSDERMAL | Status: AC

## 2014-01-01 MED ORDER — LORAZEPAM 1 MG PO TABS: 1.0000 mg | ORAL_TABLET | ORAL | Status: AC | PRN

## 2014-01-01 MED ORDER — MORPHINE SULFATE (CONCENTRATE) 10 MG /0.5 ML PO SOLN: 5.0000 mg | ORAL | Status: AC | PRN

## 2014-01-01 NOTE — Consult Note (Signed)
[facility] Liaison: Received request from CSW Cassandra for [facility] evaluation. Chart reviewed, eligibility confirmed with Director. [facility] room available [facility]2013 if transfer still desired. Spoke with patient's friend at number provided by CSW.due to patient appeared to have no available family. [facility] paperwork completed by Entergy CorporationHope Rife, Interior and spatial designerDirector of Social Work. Understand from bedside RN this morning family in room but sleeping at this time. Will follow up with CSW and family. Will need DC summary faxed to [phone] and RN to call report to [phone]. Thank you. Forrestine HimEva Davis, LCSW [phone]

## 2014-01-01 NOTE — Progress Notes (Signed)
10cc morphine wasted in sink with Merrilee SeashoreBettie Jo, RN. Vanice Sarahhompson, Taylor L

## 2014-01-01 NOTE — Progress Notes (Signed)
Patient has 2 Valuables NelsonEnvelopes, # [id number] and [id number].  Both envelopes verified and retrieved from Security. Envelopes sealed and prepared [facility].

## 2014-01-01 NOTE — Progress Notes (Signed)
Report given to Rosey Batheresa, Charity fundraiserN at [facility].  All questions answered and instructed to call back if has any further questions.  Awaiting PTAR.   Vanice Sarahhompson, Luisfernando Brightwell L

## 2014-01-01 NOTE — Progress Notes (Signed)
EMS here to take pt.  Pt IV removed and morphine drip stopped.  All belongings from safe sent with pt.  [facility]. Vanice Sarahhompson, Kaiven Vester L

## 2014-01-01 NOTE — Progress Notes (Signed)
Night RN notified for morphine drip for pt d/t low amount of morphine left in available drip.  We did not end up using the new bag of morphine because pt was discharged and my NT, Yvonna AlanisKaitlyn took bag back to main pharmacy. Vanice Sarahhompson, Taylor L

## 2014-01-01 NOTE — Progress Notes (Signed)
Pt to be d/c today to [facility].   Pt and family agreeable. Confirmed plans with facility.  Plan transfer via EMS.    Cassandra Allen LCSWA  [facility]  4N 1-16;  6N1-16 Phone: [phone]    

## 2014-01-01 NOTE — Discharge Summary (Signed)
Physician Discharge Summary  Patient ID: Darryl Mays MRN: [id number] DOB/AGE: 57-Apr-1957 57 y.o.  Admit date: 12/21/2013 Discharge date: 12/23/2013    Discharge Diagnoses:  VF arrest  Acute respiratory failure with hypoxia  Respiratory acidosis  Aspiration pneumonia  Acute systolic heart failure  CAD s/p PCI with stent to RCA  Coronary vasospasm  Paroxysmal atrial fibrillation  Metabolic acidosis  Hyperglycemia  Protein Calorie Malnutrition  Anoxic encephalopathy  Coma  Seizures  Thrombocytopenia  Hypocalcemia  Hypernatremia  Hypokalemia  Anemia of critical illness  DNR/DNI  Comfort care                                                                      DISCHARGE PLAN BY DIAGNOSIS      Discharge Diagnosis as Above  Discharge Plan: - DNR / DNI - Comfort focused care - d/c morphine gtt & IV prior to discharge - PRN roxanol for pain / shortness of breath - PRN ativan for anxiety / sleep - Scopolamine for secretions  - Transfer to [facility] is a 58 y.o. y/o male with unnown PMH who presented to the [facility] ER on 12/21/13 after suffering a V-Fib arrest.  Per EMS, he works at [employer] and was found in the restroom unresponsive.  He required defibrillation several times as outpatient and also in ED. He was intubated. Cardiology consulted for evaluation. Initial head CT was negative. He was started on hypothermia protocol. Echo showed acute systolic heart failure. He had angioplasty with insertion of bare metal stent to RCA. After rewarming he did not regain mental status. EEG showed generalized periodic discharges. Neurology was consulted. He was started on keppra. He developed aspiration pneumonia and was started on antibiotics. MRI of brain showed showed widespread cortical-subcortical hypoxic-ischemic injury with bilateral cortical and deep gray matter involvement. Neuro prognosis deemed to be very  poor.  He was made DNR, and transitioned to comfort measures only. He was extubated, and then transferred to medical floor bed.  Morphine gtt initiated for comfort.  See discharge plan as above             EVENTS / STUDIES:  6/12 - suffered out of hospital vfib arrest.  6/12 - Head CT: NAICP  6/12 - Echo: LVEF 30%. Severely dilated LV cavity. Diffuse hypokinesis worse in the septum apex and distal anterior wall  6/12 - LHC: angioplasty and bare metal stent to RCA. Diffuse CAD. Severely impaired LV function  6/15 - EEG: consistent with a severe generalized nonspecific cerebral dysfunction. The generalized periodic discharges recorded can be ictal, but the modification with noxious stimulation and lack of associated fast activity as well as slow frequency all argue for a non-ictal nature to these discharges. There was no definite seizure recorded. This EEG is reactive to outside stimuli.  6/16 - MRI brain: Profound and widespread hypoxic ischemic injury, with bilateral cortical and deep gray matter involvement  6/17 - Neuro re-eval in light of MRI results. Prognosis for meaningful recovery deemed to be very poor   LINES / TUBES:  A-line 6/12 >> 6/13  ETT 6/12 >>6/18 Left  IJ CVL 6/12 >>6/23   CULTURES:  Resp 6/16 >> NOF   ANTIBIOTICS:  Vanc 6/16 >> 6/17  Pip-tazo 6/16 >>       Discharge Exam: General: comatose, unresponsive / appears comfortable  Neuro: no response to stimuli, does not move extremities  HEENT: nose without purulence, no LN or TMG  Cardiovascular: RRR  Lungs: decreased bs throughout, +loud upper airway noise  Abdomen: BS x 4, soft, NT/ND.  Ext: no edema   Filed Vitals:   12/29/13 0505 12/30/13 0700 12/31/13 0604 12/28/2013 0645  BP: 136/62 143/62 122/74 121/62  Pulse: 81 75 96 106  Temp: 99 F (37.2 C) 98.5 F (36.9 C) 99.1 F (37.3 C) 101.2 F (38.4 C)  TempSrc: Axillary Axillary Axillary Axillary  Resp: $Remo'21 9 14 15  'jugeW$ Height:      Weight:      SpO2: 88%  94% 90% 74%     Discharge Labs  BMET  Recent Labs Lab 12/26/13 0335 12/27/13 0259  NA 146 147  K 3.4* 3.9  CL 111 112  CO2 24 24  GLUCOSE 132* 130*  BUN 15 16  CREATININE 0.66 0.70  CALCIUM 7.4* 7.9*   CBC  Recent Labs Lab 12/26/13 0335 12/27/13 0259  HGB 10.3* 10.5*  HCT 31.7* 32.3*  WBC 7.6 7.8  PLT 114* 114*     Discharge Instructions   Call MD for:  difficulty breathing, headache or visual disturbances    Complete by:  As directed      Call MD for:  severe uncontrolled pain    Complete by:  As directed      Call MD for:  temperature >100.4    Complete by:  As directed            Follow-up Information   Follow up with [facility].   Contact information:   [place] [id number]          Medication List         LORazepam 1 MG tablet  Commonly known as:  ATIVAN  Place 1 tablet (1 mg total) under the tongue every 4 (four) hours as needed for anxiety.     morphine CONCENTRATE 10 mg / 0.5 ml concentrated solution  Place 0.25 mLs (5 mg total) under the tongue every 3 (three) hours as needed for severe pain or shortness of breath.     scopolamine 1 MG/3DAYS  Commonly known as:  TRANSDERM-SCOP  Place 1 patch (1.5 mg total) onto the skin every 3 (three) days.        Disposition:  [facility]  Discharged Condition: Darryl Mays has met maximum benefit of inpatient care and is medically stable and cleared for discharge.  Patient is pending follow up as above.      Time spent on disposition:  Greater than 35 minutes.   Signed: Noe Gens, NP-C [facility] Pgr: [phone] Office: [phone]

## 2014-01-03 NOTE — Discharge Summary (Signed)
Patient seen and examined, agree with above note.  I dictated the care and orders written for this patient under my direction.  Wesam G Yacoub, MD [phone] 

## 2014-01-09 DEATH — deceased

## 2014-02-09 DEATH — deceased

## 2014-06-20 ENCOUNTER — Encounter (HOSPITAL_COMMUNITY): Payer: Self-pay | Admitting: Interventional Cardiology

## 2016-01-17 IMAGING — CR DG CHEST 1V PORT
1 series · 1 of 1 positions shown · non-contrast
Comparison: 12/26/2013.

CLINICAL DATA: Intubation.

EXAM:
PORTABLE CHEST - 1 VIEW

[AP]
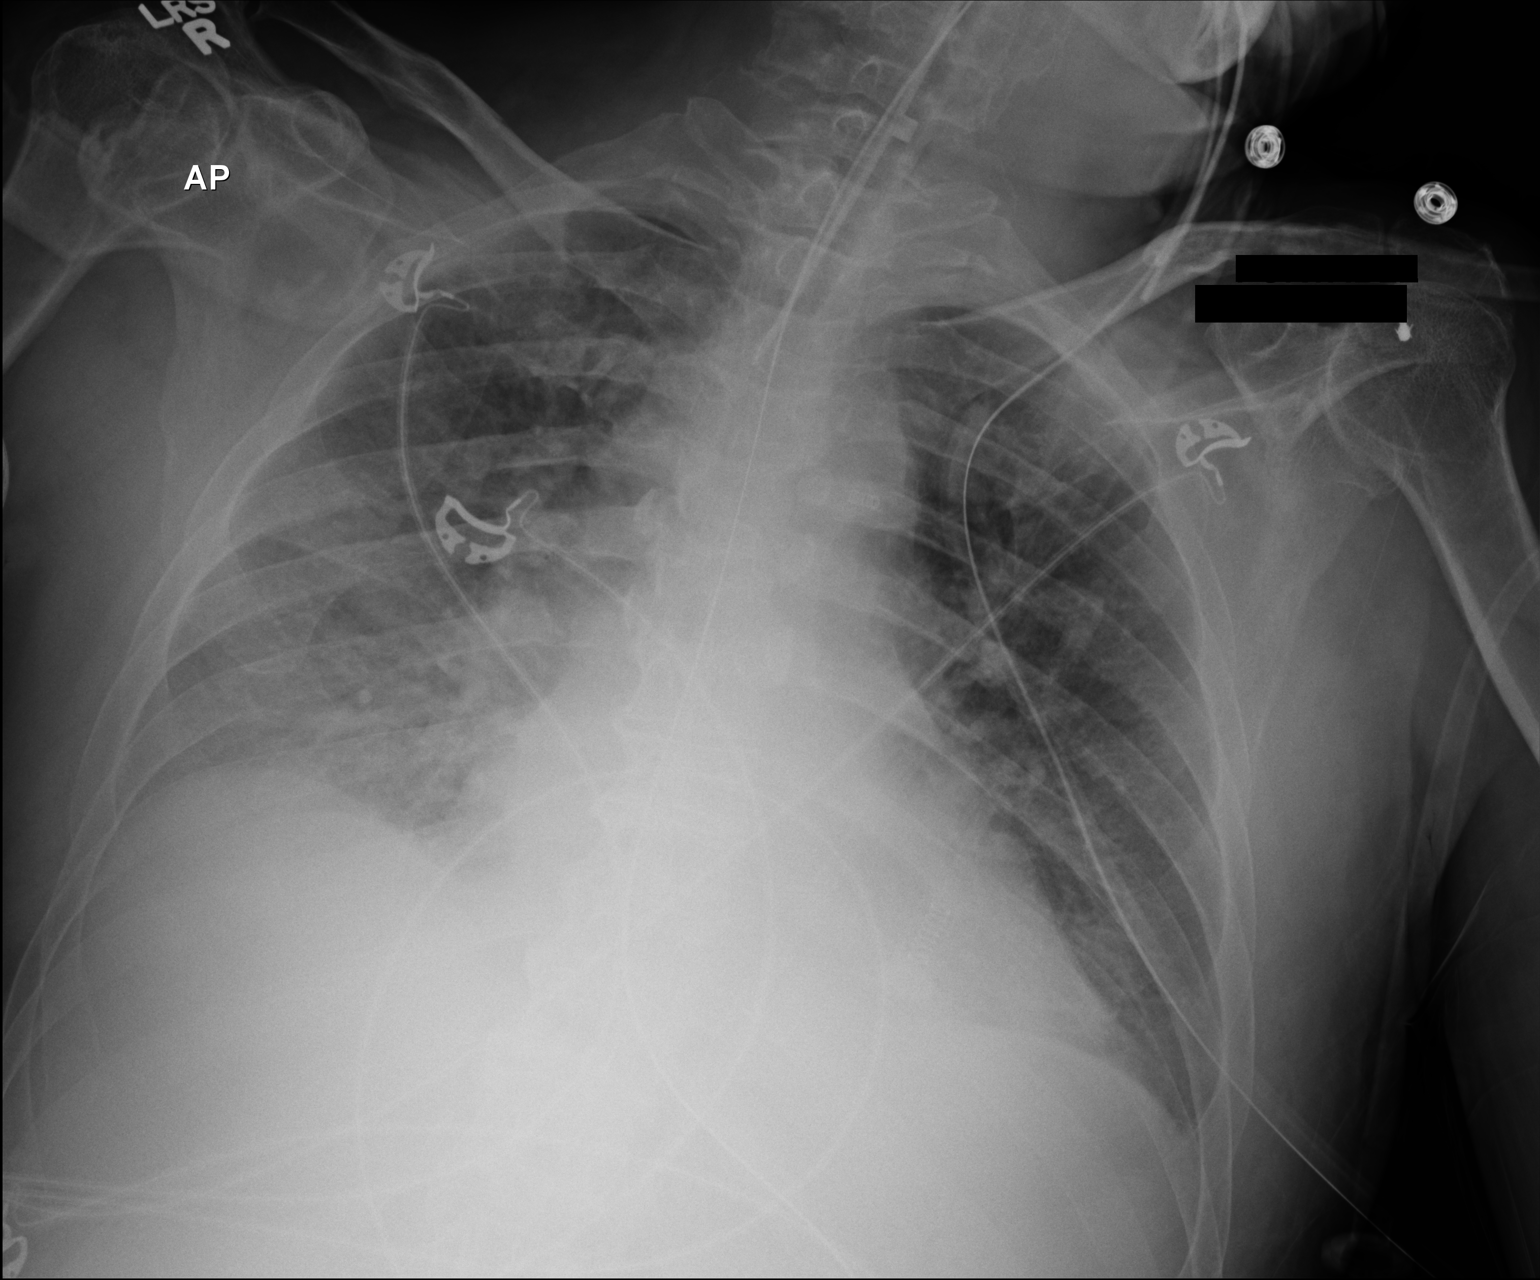

[1 of 1 positions shown; findings below may reference images not displayed]

FINDINGS: Endotracheal and NG tube in stable position. Interval removal of
left IJ tube. Persistent cardiomegaly and bilateral pulmonary
alveolar infiltrates consistent with pulmonary edema. No pleural
effusion identified. No pneumothorax. No acute bony abnormality.
IMPRESSION: 1. Interim removal biopsy tube.
2. Endotracheal tube and NG tube in stable anatomic position.
3. Persistent congestive heart failure bilateral pulmonary alveolar
edema .
